# Patient Record
Sex: Male | Born: 1955 | Race: White | Hispanic: No | Marital: Married | Smoking: Former smoker
Health system: Southern US, Community
[De-identification: ages and names within clinical notes are randomized; demographics above are authoritative.]

## PROBLEM LIST (undated history)

## (undated) DIAGNOSIS — I10 Essential (primary) hypertension: Secondary | ICD-10-CM

## (undated) DIAGNOSIS — G4730 Sleep apnea, unspecified: Secondary | ICD-10-CM

## (undated) DIAGNOSIS — M1990 Unspecified osteoarthritis, unspecified site: Secondary | ICD-10-CM

## (undated) DIAGNOSIS — R112 Nausea with vomiting, unspecified: Secondary | ICD-10-CM

## (undated) DIAGNOSIS — Z9889 Other specified postprocedural states: Secondary | ICD-10-CM

## (undated) DIAGNOSIS — K219 Gastro-esophageal reflux disease without esophagitis: Secondary | ICD-10-CM

## (undated) DIAGNOSIS — R51 Headache: Secondary | ICD-10-CM

## (undated) HISTORY — PX: ROTATOR CUFF REPAIR: SHX139

## (undated) HISTORY — PX: KNEE ARTHROSCOPY: SUR90

## (undated) HISTORY — PX: APPENDECTOMY: SHX54

## (undated) HISTORY — PX: FOOT SURGERY: SHX648

## (undated) HISTORY — PX: TONSILLECTOMY: SUR1361

## (undated) HISTORY — PX: HIP SURGERY: SHX245

## (undated) HISTORY — PX: PENILE PROSTHESIS IMPLANT: SHX240

## (undated) HISTORY — PX: HERNIA REPAIR: SHX51

## (undated) HISTORY — PX: WRIST SURGERY: SHX841

## (undated) HISTORY — PX: COLON SURGERY: SHX602

---

## 2010-06-27 ENCOUNTER — Ambulatory Visit (HOSPITAL_COMMUNITY)
Admission: RE | Admit: 2010-06-27 | Discharge: 2010-06-27 | Disposition: A | Discharge status: Home / Self Care | Payer: Worker's Compensation | Source: Ambulatory Visit | Attending: Orthopedic Surgery | Admitting: Orthopedic Surgery

## 2010-06-27 ENCOUNTER — Other Ambulatory Visit (HOSPITAL_COMMUNITY): Payer: Worker's Compensation

## 2010-06-27 ENCOUNTER — Other Ambulatory Visit (HOSPITAL_COMMUNITY): Payer: Self-pay | Admitting: Orthopedic Surgery

## 2010-06-27 ENCOUNTER — Encounter (HOSPITAL_COMMUNITY)
Admission: RE | Admit: 2010-06-27 | Discharge: 2010-06-27 | Disposition: A | Discharge status: Home / Self Care | Payer: Worker's Compensation | Source: Ambulatory Visit | Attending: Orthopedic Surgery | Admitting: Orthopedic Surgery

## 2010-06-27 DIAGNOSIS — Z01811 Encounter for preprocedural respiratory examination: Secondary | ICD-10-CM

## 2010-06-27 DIAGNOSIS — Z01812 Encounter for preprocedural laboratory examination: Secondary | ICD-10-CM | POA: Insufficient documentation

## 2010-06-27 DIAGNOSIS — Z01810 Encounter for preprocedural cardiovascular examination: Secondary | ICD-10-CM | POA: Insufficient documentation

## 2010-06-27 DIAGNOSIS — Z01818 Encounter for other preprocedural examination: Secondary | ICD-10-CM | POA: Insufficient documentation

## 2010-06-27 LAB — URINALYSIS, ROUTINE W REFLEX MICROSCOPIC
Glucose, UA: 250 mg/dL — AB
Hgb urine dipstick: NEGATIVE
Ketones, ur: NEGATIVE mg/dL
Protein, ur: NEGATIVE mg/dL

## 2010-06-27 LAB — COMPREHENSIVE METABOLIC PANEL
ALT: 24 U/L (ref 0–53)
CO2: 30 mEq/L (ref 19–32)
Calcium: 9.2 mg/dL (ref 8.4–10.5)
GFR calc Af Amer: >60 mL/min (ref >60)
GFR calc non Af Amer: >60 mL/min (ref >60)
Glucose, Bld: 88 mg/dL (ref 70–99)
Sodium: 137 mEq/L (ref 135–145)
Total Bilirubin: 0.4 mg/dL (ref 0.3–1.2)

## 2010-06-27 LAB — CBC
HCT: 43.6 % (ref 39.0–52.0)
Hemoglobin: 15.6 g/dL (ref 13.0–17.0)
MCH: 31.4 pg (ref 26.0–34.0)
MCHC: 35.8 g/dL (ref 30.0–36.0)

## 2010-07-03 ENCOUNTER — Ambulatory Visit (HOSPITAL_COMMUNITY)
Admission: RE | Admit: 2010-07-03 | Discharge: 2010-07-04 | Disposition: A | Discharge status: Home / Self Care | Payer: Worker's Compensation | Source: Ambulatory Visit | Attending: Orthopedic Surgery | Admitting: Orthopedic Surgery

## 2010-07-03 DIAGNOSIS — G4733 Obstructive sleep apnea (adult) (pediatric): Secondary | ICD-10-CM | POA: Insufficient documentation

## 2010-07-03 DIAGNOSIS — M19019 Primary osteoarthritis, unspecified shoulder: Secondary | ICD-10-CM | POA: Insufficient documentation

## 2010-07-03 DIAGNOSIS — M25819 Other specified joint disorders, unspecified shoulder: Secondary | ICD-10-CM | POA: Insufficient documentation

## 2010-07-03 DIAGNOSIS — I10 Essential (primary) hypertension: Secondary | ICD-10-CM | POA: Insufficient documentation

## 2010-07-03 DIAGNOSIS — M67919 Unspecified disorder of synovium and tendon, unspecified shoulder: Secondary | ICD-10-CM | POA: Insufficient documentation

## 2010-07-03 DIAGNOSIS — M249 Joint derangement, unspecified: Secondary | ICD-10-CM | POA: Insufficient documentation

## 2010-07-03 DIAGNOSIS — M719 Bursopathy, unspecified: Secondary | ICD-10-CM | POA: Insufficient documentation

## 2010-07-03 LAB — PROTIME-INR: Prothrombin Time: 13.3 seconds (ref 11.6–15.2)

## 2010-07-12 NOTE — Op Note (Signed)
Gary [name], Gary NO.:  [id number]  MEDICAL RECORD NO.:  [id number]  LOCATION:  5014                         FACILITY:  [facility]  PHYSICIAN:  Vania Rea. Tailer Volkert, M.D.  DATE OF BIRTH:  1955-06-23  DATE OF PROCEDURE: DATE OF DISCHARGE:                              OPERATIVE REPORT   PREOPERATIVE DIAGNOSES: 1. Chronic left shoulder impingement syndrome. 2. Left shoulder acromioclavicular joint arthropathy.  POSTOPERATIVE DIAGNOSES: 1. Chronic left shoulder impingement syndrome. 2. Left shoulder acromioclavicular joint arthropathy. 3. Partial articular rotator cuff tear. 4. Extensive glenoid labral tear.  PROCEDURES: 1. Left shoulder diagnostic arthroscopy. 2. Debridement of complex and extensive labral tear. 3. Debridement of partial articular rotator cuff tear. 4. Arthroscopic subacromial decompression and bursectomy. 5. Arthroscopic distal clavicle resection.  SURGEON:  Vania Rea. Madalyn Legner, MD  ASSISTANT:  Lucita Lora. Shuford, PAC  ANESTHESIA:  General endotracheal as well as an interscalene block.  ESTIMATED BLOOD LOSS:  Minimal.  DRAINS:  None.  HISTORY:  Gary [name] is a 55 year old gentleman who has had chronic and progressively increasing left shoulder pain with impingement syndrome symptoms that have been refractory to prolonged attempts at conservative management.  Due to his ongoing pain and functional limitations, he was brought to the operating at this time for planned left shoulder arthroscopy as described below.  We preoperatively counseled Gary [name] on treatment options as well as risks versus benefits thereof.  Possible surgical complications were reviewed including a potential for bleeding, infection, neurovascular injury, persistent pain, loss of motion, anesthetic complication, and possible need for additional surgery.  He understands and accepts and agrees with our planned procedure.  PROCEDURE IN DETAIL:  After undergoing  routine preop evaluation, the patient received prophylactic antibiotics and an interscalene block was established in the holding area by the Anesthesia Department.  Placed supine on operating table, underwent smooth induction of general endotracheal anesthesia.  Turned to the right lateral decubitus position on beanbag and appropriately padded and protected.  Left shoulder was then suspended at 70 degrees of abduction with 10 pounds of traction. Left shoulder region was sterilely prepped and draped in a standard fashion.  Time-out was called.  Posterior portal was established at the glenohumeral joint and anterior portal was established under direct visualization.  Overall, the glenohumeral articular surfaces were in good condition.  There was extensive degenerative tearing of the labrum anteriorly, superiorly, and posteriorly, and all these areas were freed with the shaver to a stable margin.  Biceps anchor was stable.  Biceps tendon was normal in caliber.  There was articular-sided tear near the distal supraspinatus which we debrided with a shaver.  I would estimate representing less than 5-10% of the thickness of tendon.  No instability patterns were noted.  At this point, final inspection and irrigation was then completed.  Fluid and instruments were removed.  Arm was dropped down to 30 degrees of abduction with arthroscope introduced into the subacromial space and posterior portal and then direct lateral portal established in the subacromial space.  Abundant dense inflammatory bursal tissue was encountered.  This was divided and excised with combination of shaver and Stryker wand.  The wand was then used to remove the periosteum from the undersurface  of the anterior half of the acromion and then a subacromial depression was performed with a burr creating a type 1 morphology.  Portals were then established directly anterior to the distal clavicle and distal clavicle resection  was performed with a burr.  Care was taken to make sure the entire circumference of the distal clavicle could be visualized to ensure adequate removal of the bone.  We then completed subacromial/subdeltoid bursectomy.  Bursal surface of rotator that was found to be intact. Final hemostasis was obtained.  Fluid and instruments were removed.  The portals were closed with Monocryl and Steri-Strips.  The bulky dry dressing was then taped to the left shoulder.  Left arm was put in sling immobilizer.  The patient was then awakened, extubated, and taken to the recovery room in stable condition.     Vania Rea. Gaige Sebo, M.D.     KMS/MEDQ  D:  07/03/2010  T:  07/04/2010  Job:  [id number]  Electronically Signed by Francena Hanly M.D. on 07/12/2010 10:33:51 AM

## 2011-08-20 ENCOUNTER — Encounter (HOSPITAL_COMMUNITY): Payer: Self-pay | Admitting: Pharmacy Technician

## 2011-08-24 NOTE — Consult Note (Signed)
Gary [name] 08/24/2011 9:00 AM Location: [facility] Patient #: [id number] WC DOB: 12/21/1955 Married / Language: English / Race: White Male   History of Present Illness(Gary [name] Gary Highman, PA-C; 08/24/2011 11:30 AM) The patient is a 56 year old male who comes in today for a preoperative History and Physical. The patient is scheduled for a ACDF C3-4 for cervical facet arthrosis (for neck pain and now significant HA) to be performed by Dr. [name], MD at [facility] on Thursday, September 03, 2011 at 0730 .    Allergies(Lori W Lamb; 08/24/2011 11:21 AM) CONTRAST DYES. 04/30/2010   Family History(Lori W Gary [name]; 08/24/2011 11:21 AM) Cancer. grandfather mothers side Cerebrovascular Accident. father Congestive Heart Failure. father Diabetes Mellitus. mother mother and brother Heart Disease. father and grandfather fathers side Hypertension. father Rheumatoid Arthritis. grandmother fathers side   Social History(Lori Zipporah Plants; 08/24/2011 11:21 AM) Alcohol use. current drinker; drinks beer; only occasionally per week Children. 0 Current work status. working full time Drug/Alcohol Rehab (Currently). no Drug/Alcohol Rehab (Previously). no Exercise. Exercises daily; does running / walking Exercises rarely; does running / walking Illicit drug use. no Living situation. live with spouse Marital status. married Number of flights of stairs before winded. greater than 5 4-5 Pain Contract. no Tobacco / smoke exposure. no Tobacco use. Former smoker. former smoker; smoke(d) less than 1/2 pack(s) per day   Medication History(Lori W Lamb; 08/24/2011 11:22 AM) Nuvigil (250MG  Tablet, 1 Oral prn) Active. PriLOSEC (40MG  Capsule DR, 1 Oral prn) Active.   Past Surgical History(Lori W Gary [name]; 08/24/2011 11:21 AM) Appendectomy Arthroscopy of Knee. right Foot Surgery. bilateral Rotator Cuff Repair. left bilateral Tonsillectomy   Other  Problems(Lori W Lamb; 08/24/2011 11:21 AM) Gastroesophageal Reflux Disease Migraine Headache Sleep Apnea  Note: ivp dye and shell fish   Review of Systems(Lori W Lamb; 08/24/2011 11:21 AM) General:Present- Weight Gain. Not Present- Chills, Fever, Night Sweats, Appetite Loss, Fatigue, Feeling sick and Weight Loss. Skin:Not Present- Itching, Rash, Skin Color Changes, Ulcer, Psoriasis and Change in Hair or Nails. HEENT:Not Present- Sensitivity to light, Hearing problems, Nose Bleed and Ringing in the Ears. Neck:Not Present- Swollen Glands and Neck Mass. Respiratory:Not Present- Snoring, Chronic Cough, Bloody sputum and Dyspnea. Cardiovascular:Not Present- Shortness of Breath, Chest Pain, Swelling of Extremities, Leg Cramps and Palpitations. Gastrointestinal:Not Present- Bloody Stool, Heartburn, Abdominal Pain, Vomiting, Nausea and Incontinence of Stool. Male Genitourinary:Not Present- Blood in Urine, Frequency, Incontinence and Nocturia. Musculoskeletal:Not Present- Muscle Weakness, Muscle Pain, Joint Stiffness, Joint Swelling, Joint Pain and Back Pain. Neurological:Present- Headaches and Dizziness. Not Present- Tingling, Numbness, Burning and Tremor. Psychiatric:Present- Memory Loss. Not Present- Anxiety and Depression. Endocrine:Not Present- Cold Intolerance, Heat Intolerance, Excessive hunger and Excessive Thirst. Hematology:Not Present- Abnormal Bleeding, Anemia, Blood Clots and Easy Bruising.   Vitals(Lori W Lamb; 08/24/2011 11:24 AM) 08/24/2011 11:22 AM Weight: 197 lb Height: 67.5 in Body Surface Area: 2.06 m Body Mass Index: 30.4 kg/m Pulse: 75 (Regular) BP: 128/86 (Sitting, Left Arm, Standard)    Physical Exam(Xavyer Steenson J Larence Thone, PA-C; 08/24/2011 11:55 AM) The physical exam findings are as follows:   General General Appearance- pleasant. Not in acute distress. Orientation- Oriented X3. Build & Nutrition- Well nourished and Well developed.  Posture- Normal posture. Gait- Normal. Mental Status- Alert.   Integumentary General Characteristics:Surgical Scars- no surgical scar evidence of previous cervical surgery. Cervical Spine- Skin examination of the cervical spine is without deformity, skin lesions, lacerations or abrasions.   Head and Neck Neck Global Assessment- supple. no lymphadenopathy and no nucchal rigidty.  Eye Pupil- Bilateral- Normal, Direct reaction to light normal, Equal and Regular. Motion- Bilateral- EOMI.   Chest and Lung Exam Auscultation: Breath sounds:- Clear.   Cardiovascular Auscultation:Rhythm- Regular rate and rhythm. Heart Sounds- Normal heart sounds.   Abdomen Palpation/Percussion:Palpation and Percussion of the abdomen reveal - Non Tender, No Rebound tenderness and Soft.   Peripheral Vascular Upper Extremity: Palpation:Radial pulse- Bilateral- 2+.   Neurologic Sensation:Upper Extremity- Bilateral- sensation is intact in the upper extremity. Reflexes:Biceps Reflex- Bilateral- 1+. Brachioradialis Reflex- Bilateral- 1+. Triceps Reflex- Bilateral- 1+. Babinski- Bilateral- Babinski not present. Clonus- Bilateral- clonus not present. Hoffman's Sign- Bilateral- Hoffman's sign not present. Testing:   Musculoskeletal Spine/Ribs/Pelvis Cervical Spine : Inspection and Palpation:Tenderness- no soft tissue tenderness to palpation and no bony tenderness to palpation. bony/soft tissue palpation of the cervical spine and shoulders does not recreate their typical pain. Strength and Tone: Strength:Deltoid- Bilateral- 5/5. Biceps- Bilateral- 5/5. Triceps- Bilateral- 5/5. Wrist Extension- Bilateral- 5/5. Hand Grip- Bilateral- 5/5. Heel walk- Bilateral- able to heel walk without difficulty. Toe Walk- Bilateral- able to walk on toes without difficulty. Heel-Toe Walk- Bilateral- able to heel-toe walk without difficulty. ROM- Flexion-  Mildly decreased and painful. Extension- Mildly decreased and painful. Pain:- neither flexion or extension is more painful than the other. Special Testing- axial compression test negative and cross chest impingement test negative. Non-Anatomic Signs- No non-anatomic signs present. Upper Extremity Range of Motion:- No truesholder pain with IR/ER of the shoulders.   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 Risks/benefits/alternatives to the procedure/expectations following the procedure have been reviewed with the patient by Dr. Shon Baton. He understands that the goal of surgery is to reduce not eliminate his pain. He understands the risks and the goals.  MRI of the cervical spine dated 04/13/11 demonstrates severe left facet arthritis C3-4 with subchondral cyst and edema in the bones and adjacent soft tissues. Inflammatory changes in the left neural foramen at C3-4.  He is scheduled to complete his pre-op hospital requirements 08/27/11. He has been medically cleared for the procedure by Dr. Leonette Most his PCP. Please see the scanned clearance form in the patients office chart for the specifics. He has been fitted for a cervical collar. He knows to bring this with him the morning of surgery.   All of his questions have been encouraged, addressed and answered. Plan, at this time, is to proceed with surgery as scheduled.   Signed electronically by Gwinda Maine, PA-C (08/24/2011 11:55 AM)   Gary [name] 07/27/2011 9:20 AM Location: [place] LOCATION Patient #: [id number] WC DOB: June 24, 1955 Married /  Language: [place] / Race: White Male   History of Present Illness(Sharon Gillian Shields; 07/27/2011 11:25 AM) The patient is a 56 year old male who presents today for follow up of their neck. The patient is being followed for their neck pain. They are 1 year(s) out from injury. The following medication has been used for pain control: Hydrocodone. The patient presents today following ESI (Bilateral C3-4 facet injections 07/22/11).    Subjective Transcription(DAHARI Sheela Stack, MD; 07/31/2011 1:29 PM)  He returns today for follow up with his wife. We have gone over the surgical procedure which is an ACDF at C3-4. I reviewed the  risks and benefits of the procedure which include infection, bleeding, nerve damage, death, stroke, paralysis, failure to heal, need for further surgery, ongoing or worse pain, throat pain, swallowing difficulties, hoarseness in the voice, loss of bowel and bladder control. All of his and his wife's questions were addressed.    Allergies(Sharon Gillian Shields; 07/27/2011 11:25 AM) CONTRAST DYES. 04/30/2010   Social History(Sharon J Roxan Hockey; 07/27/2011 11:26 AM) Tobacco use. Former smoker.   Medication History(Sharon Gillian Shields; 07/27/2011 11:26 AM) Norco (5-325MG  Tablet, Oral) Active. (prn) Valsartan (40MG  Tablet, Oral) Active. Nuvigil (250MG  Tablet, Oral) Active.   Plans Transcription(DAHARI Sheela Stack, MD; 07/31/2011 1:29 PM)  He would like to proceed with the surgery as soon as possible. As soon as we have approval from [employer], we can go ahead and set up a date for the surgery.    Miscellaneous Transcription(DAHARI Sheela Stack, MD; 07/31/2011 1:29 PM)  Venita Lick, M. D./slk    T: 07-30-11  D: 07-28-11      Signed electronically by Alvy Beal, MD (07/28/2011 12:54 PM)

## 2011-08-26 NOTE — Pre-Procedure Instructions (Signed)
20 Gary [name]  08/26/2011   Your procedure is scheduled on:  Thursday, August 29th  Report to [facility] at 0530 AM.  Call this number if you have problems the morning of surgery: [phone]   Remember:   Do not eat food or drink:After Midnight.  Take these medicines the morning of surgery with A SIP OF WATER: vicodin if needed   Do not wear jewelry.  Do not wear lotions, powders, or perfumes.   Do not shave 48 hours prior to surgery. Men may shave face and neck.  Do not bring valuables to the hospital.  Contacts, dentures or bridgework may not be worn into surgery.  Leave suitcase in the car. After surgery it may be brought to your room.  For patients admitted to the hospital, checkout time is 11:00 AM the day of discharge.   Special Instructions: CHG Shower Use Special Wash: 1/2 bottle night before surgery and 1/2 bottle morning of surgery.   Please read over the following fact sheets that you were given: Pain Booklet, Coughing and Deep Breathing, MRSA Information and Surgical Site Infection Prevention

## 2011-08-27 ENCOUNTER — Encounter (HOSPITAL_COMMUNITY): Payer: Self-pay

## 2011-08-27 ENCOUNTER — Encounter (HOSPITAL_COMMUNITY)
Admission: RE | Admit: 2011-08-27 | Discharge: 2011-08-27 | Disposition: A | Discharge status: Home / Self Care | Payer: Worker's Compensation | Source: Ambulatory Visit | Attending: Orthopedic Surgery | Admitting: Orthopedic Surgery

## 2011-08-27 ENCOUNTER — Encounter (HOSPITAL_COMMUNITY)
Admission: RE | Admit: 2011-08-27 | Discharge: 2011-08-27 | Disposition: A | Discharge status: Home / Self Care | Payer: Worker's Compensation | Source: Ambulatory Visit | Attending: Physician Assistant | Admitting: Physician Assistant

## 2011-08-27 HISTORY — DX: Gastro-esophageal reflux disease without esophagitis: K21.9

## 2011-08-27 HISTORY — DX: Headache: R51

## 2011-08-27 HISTORY — DX: Sleep apnea, unspecified: G47.30

## 2011-08-27 HISTORY — DX: Unspecified osteoarthritis, unspecified site: M19.90

## 2011-08-27 HISTORY — DX: Nausea with vomiting, unspecified: R11.2

## 2011-08-27 HISTORY — DX: Other specified postprocedural states: Z98.890

## 2011-08-27 LAB — SURGICAL PCR SCREEN: MRSA, PCR: NEGATIVE

## 2011-08-27 LAB — BASIC METABOLIC PANEL
Calcium: 10.3 mg/dL (ref 8.4–10.5)
GFR calc non Af Amer: 77 mL/min — ABNORMAL LOW (ref >90)
Glucose, Bld: 116 mg/dL — ABNORMAL HIGH (ref 70–99)
Sodium: 140 mEq/L (ref 135–145)

## 2011-08-27 LAB — CBC
HCT: 45.1 % (ref 39.0–52.0)
Hemoglobin: 15.4 g/dL (ref 13.0–17.0)
MCH: 30.6 pg (ref 26.0–34.0)
MCV: 89.5 fL (ref 78.0–100.0)
Platelets: 204 10*3/uL (ref 150–400)
RBC: 5.04 MIL/uL (ref 4.22–5.81)

## 2011-08-27 NOTE — Progress Notes (Addendum)
REQUESTED SLEEP STUDY FROM PATIENT'S MEDICAL MD DR. KALISH IN [place]. ([facility])

## 2011-09-02 MED ORDER — CEFAZOLIN SODIUM-DEXTROSE 2-3 GM-% IV SOLR
2.0000 g | INTRAVENOUS | Status: AC
  Administered 2011-09-03: 2 g via INTRAVENOUS
  Filled 2011-09-02: qty 50

## 2011-09-03 ENCOUNTER — Ambulatory Visit (HOSPITAL_COMMUNITY): Payer: Worker's Compensation | Admitting: Anesthesiology

## 2011-09-03 ENCOUNTER — Encounter (HOSPITAL_COMMUNITY): Payer: Self-pay | Admitting: Anesthesiology

## 2011-09-03 ENCOUNTER — Encounter (HOSPITAL_COMMUNITY)
Admission: RE | Disposition: A | Discharge status: Home / Self Care | Payer: Self-pay | Source: Ambulatory Visit | Attending: Orthopedic Surgery

## 2011-09-03 ENCOUNTER — Ambulatory Visit (HOSPITAL_COMMUNITY): Payer: Worker's Compensation

## 2011-09-03 ENCOUNTER — Ambulatory Visit (HOSPITAL_COMMUNITY)
Admission: RE | Admit: 2011-09-03 | Discharge: 2011-09-04 | Disposition: A | Discharge status: Home / Self Care | Payer: Worker's Compensation | Source: Ambulatory Visit | Attending: Orthopedic Surgery | Admitting: Orthopedic Surgery

## 2011-09-03 ENCOUNTER — Encounter (HOSPITAL_COMMUNITY): Payer: Self-pay | Admitting: *Deleted

## 2011-09-03 DIAGNOSIS — M47812 Spondylosis without myelopathy or radiculopathy, cervical region: Secondary | ICD-10-CM | POA: Insufficient documentation

## 2011-09-03 DIAGNOSIS — Z01812 Encounter for preprocedural laboratory examination: Secondary | ICD-10-CM | POA: Insufficient documentation

## 2011-09-03 DIAGNOSIS — G4730 Sleep apnea, unspecified: Secondary | ICD-10-CM | POA: Insufficient documentation

## 2011-09-03 DIAGNOSIS — Z01818 Encounter for other preprocedural examination: Secondary | ICD-10-CM | POA: Insufficient documentation

## 2011-09-03 DIAGNOSIS — K219 Gastro-esophageal reflux disease without esophagitis: Secondary | ICD-10-CM | POA: Insufficient documentation

## 2011-09-03 DIAGNOSIS — M542 Cervicalgia: Secondary | ICD-10-CM

## 2011-09-03 HISTORY — PX: ANTERIOR CERVICAL DECOMP/DISCECTOMY FUSION: SHX1161

## 2011-09-03 HISTORY — DX: Essential (primary) hypertension: I10

## 2011-09-03 HISTORY — PX: CERVICAL FUSION: SHX112

## 2011-09-03 SURGERY — ANTERIOR CERVICAL DECOMPRESSION/DISCECTOMY FUSION 1 LEVEL
Anesthesia: General | Site: Back | Wound class: Clean

## 2011-09-03 MED ORDER — SODIUM CHLORIDE 0.9 % IV SOLN: 250.0000 mL | INTRAVENOUS | Status: DC

## 2011-09-03 MED ORDER — DEXAMETHASONE SODIUM PHOSPHATE 4 MG/ML IJ SOLN
4.0000 mg | Freq: Four times a day (QID) | INTRAMUSCULAR | Status: DC
  Administered 2011-09-03 – 2011-09-04 (×3): 4 mg via INTRAVENOUS
  Filled 2011-09-03 (×10): qty 1

## 2011-09-03 MED ORDER — MENTHOL 3 MG MT LOZG
1.0000 | LOZENGE | OROMUCOSAL | Status: DC | PRN
  Filled 2011-09-03: qty 9

## 2011-09-03 MED ORDER — THROMBIN 20000 UNITS EX SOLR
CUTANEOUS | Status: DC | PRN
  Administered 2011-09-03: 08:00:00 via TOPICAL

## 2011-09-03 MED ORDER — NEOSTIGMINE METHYLSULFATE 1 MG/ML IJ SOLN
INTRAMUSCULAR | Status: DC | PRN
  Administered 2011-09-03: 4 mg via INTRAVENOUS

## 2011-09-03 MED ORDER — ZOLPIDEM TARTRATE 5 MG PO TABS: 5.0000 mg | ORAL_TABLET | Freq: Every evening | ORAL | Status: DC | PRN

## 2011-09-03 MED ORDER — BUPIVACAINE-EPINEPHRINE 0.25% -1:200000 IJ SOLN
INTRAMUSCULAR | Status: DC | PRN
  Administered 2011-09-03: 2 mL

## 2011-09-03 MED ORDER — METHOCARBAMOL 500 MG PO TABS
500.0000 mg | ORAL_TABLET | Freq: Four times a day (QID) | ORAL | Status: DC | PRN
  Administered 2011-09-03: 500 mg via ORAL
  Filled 2011-09-03: qty 1

## 2011-09-03 MED ORDER — GLYCOPYRROLATE 0.2 MG/ML IJ SOLN
INTRAMUSCULAR | Status: DC | PRN
  Administered 2011-09-03: .6 mg via INTRAVENOUS

## 2011-09-03 MED ORDER — METHOCARBAMOL 100 MG/ML IJ SOLN
500.0000 mg | Freq: Four times a day (QID) | INTRAMUSCULAR | Status: DC | PRN
  Administered 2011-09-03: 500 mg via INTRAVENOUS
  Filled 2011-09-03: qty 5

## 2011-09-03 MED ORDER — ONDANSETRON HCL 4 MG/2ML IJ SOLN
INTRAMUSCULAR | Status: DC | PRN
  Administered 2011-09-03: 4 mg via INTRAVENOUS

## 2011-09-03 MED ORDER — LACTATED RINGERS IV SOLN
INTRAVENOUS | Status: DC
  Administered 2011-09-03 – 2011-09-04 (×2): via INTRAVENOUS

## 2011-09-03 MED ORDER — THROMBIN 20000 UNITS EX SOLR: CUTANEOUS | Status: DC | PRN

## 2011-09-03 MED ORDER — SODIUM CHLORIDE 0.9 % IJ SOLN: 3.0000 mL | Freq: Two times a day (BID) | INTRAMUSCULAR | Status: DC

## 2011-09-03 MED ORDER — ONDANSETRON HCL 4 MG/2ML IJ SOLN: 4.0000 mg | Freq: Once | INTRAMUSCULAR | Status: DC | PRN

## 2011-09-03 MED ORDER — FENTANYL CITRATE 0.05 MG/ML IJ SOLN
INTRAMUSCULAR | Status: DC | PRN
  Administered 2011-09-03: 50 ug via INTRAVENOUS
  Administered 2011-09-03: 150 ug via INTRAVENOUS
  Administered 2011-09-03 (×2): 50 ug via INTRAVENOUS
  Administered 2011-09-03: 25 ug via INTRAVENOUS

## 2011-09-03 MED ORDER — HYDROMORPHONE HCL PF 1 MG/ML IJ SOLN
INTRAMUSCULAR | Status: AC
  Filled 2011-09-03: qty 1

## 2011-09-03 MED ORDER — CEFAZOLIN SODIUM 1-5 GM-% IV SOLN
1.0000 g | Freq: Three times a day (TID) | INTRAVENOUS | Status: AC
  Administered 2011-09-03 (×2): 1 g via INTRAVENOUS
  Filled 2011-09-03 (×2): qty 50

## 2011-09-03 MED ORDER — PHENOL 1.4 % MT LIQD
1.0000 | OROMUCOSAL | Status: DC | PRN
  Administered 2011-09-03: 1 via OROMUCOSAL
  Filled 2011-09-03: qty 177

## 2011-09-03 MED ORDER — ACETAMINOPHEN 10 MG/ML IV SOLN
1000.0000 mg | Freq: Four times a day (QID) | INTRAVENOUS | Status: AC
  Administered 2011-09-03 – 2011-09-04 (×4): 1000 mg via INTRAVENOUS
  Filled 2011-09-03 (×4): qty 100

## 2011-09-03 MED ORDER — LACTATED RINGERS IV SOLN
INTRAVENOUS | Status: DC | PRN
  Administered 2011-09-03 (×2): via INTRAVENOUS

## 2011-09-03 MED ORDER — PROPOFOL 10 MG/ML IV EMUL
INTRAVENOUS | Status: DC | PRN
  Administered 2011-09-03: 130 mg via INTRAVENOUS

## 2011-09-03 MED ORDER — ACETAMINOPHEN 10 MG/ML IV SOLN
INTRAVENOUS | Status: AC
  Filled 2011-09-03: qty 100

## 2011-09-03 MED ORDER — LACTATED RINGERS IV SOLN: INTRAVENOUS | Status: DC

## 2011-09-03 MED ORDER — MEPERIDINE HCL 25 MG/ML IJ SOLN: 6.2500 mg | INTRAMUSCULAR | Status: DC | PRN

## 2011-09-03 MED ORDER — DEXAMETHASONE SODIUM PHOSPHATE 10 MG/ML IJ SOLN
INTRAMUSCULAR | Status: DC | PRN
  Administered 2011-09-03: 10 mg via INTRAVENOUS

## 2011-09-03 MED ORDER — OXYCODONE HCL 5 MG PO TABS: 10.0000 mg | ORAL_TABLET | ORAL | Status: DC | PRN

## 2011-09-03 MED ORDER — SODIUM CHLORIDE 0.9 % IJ SOLN: 3.0000 mL | INTRAMUSCULAR | Status: DC | PRN

## 2011-09-03 MED ORDER — ROCURONIUM BROMIDE 100 MG/10ML IV SOLN
INTRAVENOUS | Status: DC | PRN
  Administered 2011-09-03: 10 mg via INTRAVENOUS
  Administered 2011-09-03: 50 mg via INTRAVENOUS

## 2011-09-03 MED ORDER — HYDROMORPHONE HCL PF 1 MG/ML IJ SOLN
0.2500 mg | INTRAMUSCULAR | Status: DC | PRN
  Administered 2011-09-03 (×4): 0.5 mg via INTRAVENOUS

## 2011-09-03 MED ORDER — MIDAZOLAM HCL 5 MG/5ML IJ SOLN
INTRAMUSCULAR | Status: DC | PRN
  Administered 2011-09-03: 2 mg via INTRAVENOUS

## 2011-09-03 MED ORDER — ACETAMINOPHEN 10 MG/ML IV SOLN
INTRAVENOUS | Status: DC | PRN
  Administered 2011-09-03: 1000 mg via INTRAVENOUS

## 2011-09-03 MED ORDER — DOCUSATE SODIUM 100 MG PO CAPS
100.0000 mg | ORAL_CAPSULE | Freq: Two times a day (BID) | ORAL | Status: DC
  Administered 2011-09-03 (×2): 100 mg via ORAL
  Filled 2011-09-03 (×3): qty 1

## 2011-09-03 MED ORDER — THROMBIN 20000 UNITS EX SOLR
CUTANEOUS | Status: AC
  Filled 2011-09-03: qty 20000

## 2011-09-03 MED ORDER — MORPHINE SULFATE 2 MG/ML IJ SOLN
1.0000 mg | INTRAMUSCULAR | Status: DC | PRN
  Administered 2011-09-03 (×3): 2 mg via INTRAVENOUS
  Administered 2011-09-04: 4 mg via INTRAVENOUS
  Filled 2011-09-03 (×3): qty 1
  Filled 2011-09-03: qty 2

## 2011-09-03 MED ORDER — LIDOCAINE HCL (CARDIAC) 20 MG/ML IV SOLN
INTRAVENOUS | Status: DC | PRN
  Administered 2011-09-03: 100 mg via INTRAVENOUS

## 2011-09-03 MED ORDER — PROPOFOL 10 MG/ML IV BOLUS
INTRAVENOUS | Status: DC | PRN
  Administered 2011-09-03: 130 mg via INTRAVENOUS

## 2011-09-03 MED ORDER — ONDANSETRON HCL 4 MG/2ML IJ SOLN
4.0000 mg | INTRAMUSCULAR | Status: DC | PRN
  Administered 2011-09-03: 4 mg via INTRAVENOUS
  Filled 2011-09-03: qty 2

## 2011-09-03 MED ORDER — DEXAMETHASONE 4 MG PO TABS
4.0000 mg | ORAL_TABLET | Freq: Four times a day (QID) | ORAL | Status: DC
  Administered 2011-09-03: 4 mg via ORAL
  Filled 2011-09-03 (×8): qty 1

## 2011-09-03 MED ORDER — BUPIVACAINE-EPINEPHRINE PF 0.25-1:200000 % IJ SOLN
INTRAMUSCULAR | Status: AC
  Filled 2011-09-03: qty 30

## 2011-09-03 SURGICAL SUPPLY — 62 items
BLADE SURG ROTATE 9660 (MISCELLANEOUS) IMPLANT
BUR EGG ELITE 4.0 (BURR) IMPLANT
BUR MATCHSTICK NEURO 3.0 LAGG (BURR) IMPLANT
CANISTER SUCTION 2500CC (MISCELLANEOUS) ×2 IMPLANT
CLOTH BEACON ORANGE TIMEOUT ST (SAFETY) ×2 IMPLANT
CLSR STERI-STRIP ANTIMIC 1/2X4 (GAUZE/BANDAGES/DRESSINGS) ×2 IMPLANT
CORDS BIPOLAR (ELECTRODE) ×2 IMPLANT
COVER SURGICAL LIGHT HANDLE (MISCELLANEOUS) ×2 IMPLANT
CRADLE DONUT ADULT HEAD (MISCELLANEOUS) ×2 IMPLANT
DERMABOND ADVANCED (GAUZE/BANDAGES/DRESSINGS)
DERMABOND ADVANCED .7 DNX12 (GAUZE/BANDAGES/DRESSINGS) IMPLANT
DRAPE C-ARM 42X72 X-RAY (DRAPES) ×2 IMPLANT
DRAPE POUCH INSTRU U-SHP 10X18 (DRAPES) ×2 IMPLANT
DRAPE SURG 17X23 STRL (DRAPES) IMPLANT
DRAPE U-SHAPE 47X51 STRL (DRAPES) ×2 IMPLANT
DRSG MEPILEX BORDER 4X4 (GAUZE/BANDAGES/DRESSINGS) IMPLANT
DURAPREP 26ML APPLICATOR (WOUND CARE) ×2 IMPLANT
ELECT COATED BLADE 2.86 ST (ELECTRODE) ×2 IMPLANT
ELECT REM PT RETURN 9FT ADLT (ELECTROSURGICAL) ×2
ELECTRODE REM PT RTRN 9FT ADLT (ELECTROSURGICAL) ×1 IMPLANT
GLOVE BIOGEL PI IND STRL 6.5 (GLOVE) ×2 IMPLANT
GLOVE BIOGEL PI IND STRL 8.5 (GLOVE) ×1 IMPLANT
GLOVE BIOGEL PI INDICATOR 6.5 (GLOVE) ×2
GLOVE BIOGEL PI INDICATOR 8.5 (GLOVE) ×1
GLOVE ECLIPSE 6.0 STRL STRAW (GLOVE) ×2 IMPLANT
GLOVE ECLIPSE 8.5 STRL (GLOVE) ×2 IMPLANT
GLOVE SURG SS PI 6.5 STRL IVOR (GLOVE) ×4 IMPLANT
GOWN PREVENTION PLUS XLARGE (GOWN DISPOSABLE) IMPLANT
GOWN PREVENTION PLUS XXLARGE (GOWN DISPOSABLE) ×2 IMPLANT
GOWN STRL NON-REIN LRG LVL3 (GOWN DISPOSABLE) ×2 IMPLANT
GOWN STRL REIN XL XLG (GOWN DISPOSABLE) ×4 IMPLANT
INTERLOCK LRDTC CRVCL VBR 7MM (Bone Implant) ×1 IMPLANT
KIT BASIN OR (CUSTOM PROCEDURE TRAY) ×2 IMPLANT
KIT ROOM TURNOVER OR (KITS) ×2 IMPLANT
LORDOTIC CERVICAL VBR 7MM SM (Bone Implant) ×2 IMPLANT
NEEDLE SPNL 18GX3.5 QUINCKE PK (NEEDLE) ×2 IMPLANT
NS IRRIG 1000ML POUR BTL (IV SOLUTION) ×2 IMPLANT
PACK ORTHO CERVICAL (CUSTOM PROCEDURE TRAY) ×2 IMPLANT
PACK UNIVERSAL I (CUSTOM PROCEDURE TRAY) ×2 IMPLANT
PAD ARMBOARD 7.5X6 YLW CONV (MISCELLANEOUS) ×4 IMPLANT
PIN DISTRACTION 12MM (PIN) ×2
PIN DSTRCT 12XNS SS ACIS (PIN) ×2 IMPLANT
PLATE LV 1 12MM (Plate) ×2 IMPLANT
PUTTY BONE DBX 2.5 MIS (Bone Implant) ×2 IMPLANT
SCREW 4.0X14MM (Screw) ×2 IMPLANT
SCREW 4.0X16MM (Screw) ×4 IMPLANT
SCREW BN 14X4XSLF DRL VA SLF (Screw) ×2 IMPLANT
SPONGE INTESTINAL PEANUT (DISPOSABLE) ×2 IMPLANT
SPONGE SURGIFOAM ABS GEL 100 (HEMOSTASIS) ×2 IMPLANT
STRIP CLOSURE SKIN 1/2X4 (GAUZE/BANDAGES/DRESSINGS) IMPLANT
SURGIFLO TRUKIT (HEMOSTASIS) IMPLANT
SUT MNCRL AB 3-0 PS2 18 (SUTURE) ×2 IMPLANT
SUT SILK 2 0 (SUTURE)
SUT SILK 2-0 18XBRD TIE 12 (SUTURE) IMPLANT
SUT VIC AB 2-0 CT1 18 (SUTURE) ×2 IMPLANT
SYR BULB IRRIGATION 50ML (SYRINGE) ×2 IMPLANT
SYR CONTROL 10ML LL (SYRINGE) ×2 IMPLANT
TAPE CLOTH 4X10 WHT NS (GAUZE/BANDAGES/DRESSINGS) ×4 IMPLANT
TAPE UMBILICAL COTTON 1/8X30 (MISCELLANEOUS) ×2 IMPLANT
TOWEL OR 17X24 6PK STRL BLUE (TOWEL DISPOSABLE) ×2 IMPLANT
TOWEL OR 17X26 10 PK STRL BLUE (TOWEL DISPOSABLE) ×2 IMPLANT
WATER STERILE IRR 1000ML POUR (IV SOLUTION) IMPLANT

## 2011-09-03 NOTE — Transfer of Care (Signed)
Immediate Anesthesia Transfer of Care Note  Patient: Gary [name]  Procedure(s) Performed: Procedure(s) (LRB): ANTERIOR CERVICAL DECOMPRESSION/DISCECTOMY FUSION 1 LEVEL (N/A)  Patient Location: PACU  Anesthesia Type: General  Level of Consciousness: awake, alert  and oriented  Airway & Oxygen Therapy: Patient Spontanous Breathing and Patient connected to face mask oxygen  Post-op Assessment: Report given to PACU RN, Post -op Vital signs reviewed and stable and Patient moving all extremities  Post vital signs: Reviewed and stable  Complications: No apparent anesthesia complications

## 2011-09-03 NOTE — H&P (Signed)
H+P reviewed (written by my PA Norval Gable - listed as a consult note) No change to clinical exam

## 2011-09-03 NOTE — Brief Op Note (Signed)
09/03/2011  9:55 AM  PATIENT:  Gary [name]  56 y.o. male  PRE-OPERATIVE DIAGNOSIS:  cervical facet arthrosis   POST-OPERATIVE DIAGNOSIS:  cervical facet arthrosis   PROCEDURE:  Procedure(s) (LRB): ANTERIOR CERVICAL DECOMPRESSION/DISCECTOMY FUSION 1 LEVEL (N/A)  SURGEON:  Surgeon(s) and Role:    * Venita Lick, MD - Primary  PHYSICIAN ASSISTANT:   ASSISTANTS: DIANA KOVACH   ANESTHESIA:   general  EBL:  Total I/O In: 1000 [I.V.:1000] Out: 50 [Blood:50]  BLOOD ADMINISTERED:none  DRAINS: none   LOCAL MEDICATIONS USED:  MARCAINE     SPECIMEN:  No Specimen  DISPOSITION OF SPECIMEN:  N/A  COUNTS:  YES  TOURNIQUET:  * No tourniquets in log *  DICTATION: .Other Dictation: Dictation Number [id number]  PLAN OF CARE: Admit for overnight observation  PATIENT DISPOSITION:  PACU - hemodynamically stable.

## 2011-09-03 NOTE — Anesthesia Postprocedure Evaluation (Signed)
Anesthesia Post Note  Patient: Gary [name]  Procedure(s) Performed: Procedure(s) (LRB): ANTERIOR CERVICAL DECOMPRESSION/DISCECTOMY FUSION 1 LEVEL (N/A)  Anesthesia type: general  Patient location: PACU  Post pain: Pain level controlled  Post assessment: Patient's Cardiovascular Status Stable  Last Vitals:  Filed Vitals:   09/03/11 1215  BP: 147/89  Pulse: 82  Temp:   Resp: 10    Post vital signs: Reviewed and stable  Level of consciousness: sedated  Complications: No apparent anesthesia complications

## 2011-09-03 NOTE — Anesthesia Procedure Notes (Signed)
Procedure Name: Intubation Date/Time: 09/03/2011 7:48 AM Performed by: Julianne Rice K Pre-anesthesia Checklist: Emergency Drugs available, Patient identified, Timeout performed, Suction available and Patient being monitored Patient Re-evaluated:Patient Re-evaluated prior to inductionOxygen Delivery Method: Circle system utilized Preoxygenation: Pre-oxygenation with 100% oxygen Ventilation: Mask ventilation without difficulty Laryngoscope Size: Mac and 3 Grade View: Grade II Tube type: Oral Tube size: 8.0 mm Number of attempts: 1 Airway Equipment and Method: Stylet and LTA kit utilized Placement Confirmation: ETT inserted through vocal cords under direct vision,  positive ETCO2 and breath sounds checked- equal and bilateral Secured at: 24 cm Tube secured with: Tape Dental Injury: Teeth and Oropharynx as per pre-operative assessment

## 2011-09-03 NOTE — Anesthesia Preprocedure Evaluation (Addendum)
Anesthesia Evaluation  Patient identified by MRN, date of birth, ID band Patient awake    Reviewed: Allergy & Precautions, H&P , NPO status , Patient's Chart, lab work & pertinent test results  History of Anesthesia Complications (+) PONV  Airway       Dental   Pulmonary sleep apnea ,          Cardiovascular negative cardio ROS      Neuro/Psych  Headaches, negative psych ROS   GI/Hepatic Neg liver ROS, GERD-  Controlled and Medicated,Medicines prn   Endo/Other  negative endocrine ROS  Renal/GU negative Renal ROS     Musculoskeletal negative musculoskeletal ROS (+)   Abdominal   Peds  Hematology negative hematology ROS (+)   Anesthesia Other Findings   Reproductive/Obstetrics                          Anesthesia Physical Anesthesia Plan  ASA: II  Anesthesia Plan: General   Post-op Pain Management:    Induction: Intravenous  Airway Management Planned: Oral ETT  Additional Equipment:   Intra-op Plan:   Post-operative Plan: Extubation in OR  Informed Consent: I have reviewed the patients History and Physical, chart, labs and discussed the procedure including the risks, benefits and alternatives for the proposed anesthesia with the patient or authorized representative who has indicated his/her understanding and acceptance.   Dental advisory given  Plan Discussed with: Surgeon and Anesthesiologist  Anesthesia Plan Comments:         Anesthesia Quick Evaluation

## 2011-09-03 NOTE — Preoperative (Signed)
Beta Blockers   Reason not to administer Beta Blockers:Not Applicable 

## 2011-09-04 ENCOUNTER — Encounter (HOSPITAL_COMMUNITY): Payer: Self-pay | Admitting: Orthopedic Surgery

## 2011-09-04 MED ORDER — ONDANSETRON HCL 4 MG PO TABS: 4.0000 mg | ORAL_TABLET | Freq: Three times a day (TID) | ORAL | Status: AC | PRN

## 2011-09-04 MED ORDER — POLYETHYLENE GLYCOL 3350 17 G PO PACK: 17.0000 g | PACK | Freq: Every day | ORAL | Status: AC

## 2011-09-04 MED ORDER — OXYCODONE-ACETAMINOPHEN 10-325 MG PO TABS: 1.0000 | ORAL_TABLET | Freq: Four times a day (QID) | ORAL | Status: AC | PRN

## 2011-09-04 MED ORDER — METHOCARBAMOL 500 MG PO TABS: 500.0000 mg | ORAL_TABLET | Freq: Three times a day (TID) | ORAL | Status: AC

## 2011-09-04 NOTE — Progress Notes (Signed)
    Subjective: Procedure(s) (LRB): ANTERIOR CERVICAL DECOMPRESSION/DISCECTOMY FUSION 1 LEVEL (N/A) 1 Day Post-Op  Patient reports pain as 3 on 0-10 scale.  Reports decreased arm pain denies incisional neck pain   Positive void Negative bowel movement Positive flatus Negative chest pain or shortness of breath  Objective: Vital signs in last 24 hours: Temp:  [97 F (36.1 C)-98.2 F (36.8 C)] 98 F (36.7 C) (08/30 0521) Pulse Rate:  [63-89] 82  (08/30 0521) Resp:  [5-18] 17  (08/30 0521) BP: (115-168)/(71-97) 145/81 mmHg (08/30 0521) SpO2:  [96 %-100 %] 96 % (08/30 0521)  Intake/Output from previous day: 08/29 0701 - 08/30 0700 In: 3273.4 [P.O.:480; I.V.:2336.4; IV Piggyback:457] Out: 1850 [Urine:1800; Blood:50]  Labs: No results found for this basename: WBC:2,RBC:2,HCT:2,PLT:2 in the last 72 hours No results found for this basename: NA:2,K:2,CL:2,CO2:2,BUN:2,CREATININE:2,GLUCOSE:2,CALCIUM:2 in the last 72 hours No results found for this basename: LABPT:2,INR:2 in the last 72 hours  Physical Exam: Neurologically intact ABD soft Neurovascular intact Incision: dressing C/D/I, no drainage and no swelling at incision site Compartment soft  Assessment/Plan: Patient stable  xrays satisfactory Mobilization with physical therapy Encourage incentive spirometry Continue care  Advance diet Up with therapy D/C to home today  Venita Lick, MD [facility] [phone]

## 2011-09-04 NOTE — Progress Notes (Signed)
Patient discharged to home in care of spouse. Medications and instructions reviewed with patient and spouse with all questions answered. IV d/c'd with cath intact. Assessment unchanged from this am. Patient is to follow up with Dr. Shon Baton in 2 weeks.

## 2011-09-04 NOTE — Progress Notes (Signed)
CARE MANAGEMENT NOTE 09/04/2011  Patient:  Gary [name], Gary [name]   Account Number:  [id number]  Date Initiated:  09/04/2011  Documentation initiated by:  Vance Peper  Subjective/Objective Assessment:   56 yr old male s/p C 3-4 fusion     Action/Plan:   CM spoke with patient regarding needs at discharge. Patient is under worker's comp. concerned about getting meds. CM contacted Mariea Clonts, RN [phone]- states pt can go to his Pharmacy and fill.   Anticipated DC Date:  09/04/2011   Anticipated DC Plan:  HOME/SELF CARE      DC Planning Services  CM consult      PAC Choice  NA   Choice offered to / List presented to:             Status of service:  Completed, signed off Medicare Important Message given?   (If response is "NO", the following Medicare IM given date fields will be blank) Date Medicare IM given:   Date Additional Medicare IM given:    Discharge Disposition:  HOME/SELF CARE  Per UR Regulation:    If discussed at Long Length of Stay Meetings, dates discussed:    Comments:

## 2011-09-04 NOTE — Evaluation (Addendum)
Occupational Therapy Evaluation and Discharge  Patient Details Name: Gary [name] MRN: [id number] DOB: 08/06/1955 Today's Date: 09/04/2011 Time: 0840-0900 OT Time Calculation (min): 20 min  OT Assessment / Plan / Recommendation Clinical Impression  Pt admitted for cervical sx and presents near baseline functioning. All education completed including C-collar care/management, precautions and adaptive techniques to maintain precautions. No further acute OT indicated at this time. No DME needs    OT Assessment  Patient does not need any further OT services    Follow Up Recommendations  No OT follow up    Barriers to Discharge      Equipment Recommendations  None recommended by OT;None recommended by PT    Recommendations for Other Services    Frequency       Precautions / Restrictions Precautions Precautions: Cervical Precaution Comments: cervical precautions Required Braces or Orthoses: Cervical Brace Cervical Brace: Hard collar;Applied in sitting position Restrictions Weight Bearing Restrictions: No   Pertinent Vitals/Pain Pt reports 5/10 throat pain- premedicated    ADL  Eating/Feeding: Performed;Independent (instructed to doff collar for eating but maintain precaution) Where Assessed - Eating/Feeding: Chair Grooming: Performed;Supervision/safety Where Assessed - Grooming: Unsupported standing (VCs for adaptive tech to maintain precautions) Upper Body Dressing: Performed;Min guard Where Assessed - Upper Body Dressing: Unsupported sitting Lower Body Dressing: Performed;Supervision/safety Where Assessed - Lower Body Dressing: Unsupported sit to stand Toilet Transfer: Performed;Independent Toilet Transfer Method: Sit to Barista: Regular height toilet Toileting - Clothing Manipulation and Hygiene: Simulated;Independent Where Assessed - Toileting Clothing Manipulation and Hygiene: Standing Tub/Shower Transfer: Performed;Independent Tub/Shower  Transfer Method: Ambulating Equipment Used: Gait belt (C-collar) Transfers/Ambulation Related to ADLs: Ind with ambulation    OT Diagnosis:    OT Problem List:   OT Treatment Interventions:     OT Goals    Visit Information  Last OT Received On: 09/04/11 Assistance Needed: +1    Subjective Data  Subjective: I going home today. Patient Stated Goal: Return home   Prior Functioning  Vision/Perception  Home Living Lives With: Spouse Available Help at Discharge: Family Type of Home: House Home Access: Stairs to enter Secretary/administrator of Steps: 1 Entrance Stairs-Rails: None Home Layout: One level Bathroom Shower/Tub: Forensic scientist: Standard Bathroom Accessibility: Yes Home Adaptive Equipment: None Prior Function Level of Independence: Independent Able to Take Stairs?: Yes Driving: Yes Vocation: Workers Chief Operating Officer: No difficulties Dominant Hand: Right      Cognition  Overall Cognitive Status: Appears within functional limits for tasks assessed/performed Arousal/Alertness: Awake/alert Orientation Level: Appears intact for tasks assessed Behavior During Session: [facility] for tasks performed    Extremity/Trunk Assessment Right Upper Extremity Assessment RUE ROM/Strength/Tone: WFL for tasks assessed RUE Sensation: WFL - Light Touch RUE Coordination: WFL - gross/fine motor Left Upper Extremity Assessment LUE ROM/Strength/Tone: WFL for tasks assessed LUE Sensation: WFL - Light Touch LUE Coordination: WFL - gross/fine motor   Mobility  Shoulder Instructions  Bed Mobility Bed Mobility: Supine to Sit Supine to Sit: 6: Modified independent (Device/Increase time) Transfers Sit to Stand: 7: Independent;From bed Stand to Sit: 7: Independent;To chair/3-in-1       Exercise     Balance     End of Session OT - End of Session Equipment Utilized During Treatment: Gait belt Activity Tolerance: Patient tolerated  treatment well Patient left: in chair;with call bell/phone within reach;with family/visitor present Nurse Communication: Mobility status  GO Functional Assessment Tool Used: clinical observation Functional Limitation: Self care Self Care Current Status (W0981): At least 20  percent but less than 40 percent impaired, limited or restricted Self Care Goal Status (Z6109): At least 1 percent but less than 20 percent impaired, limited or restricted Self Care Discharge Status [phone]): At least 1 percent but less than 20 percent impaired, limited or restricted   Chellsea Beckers 09/04/2011, 2:57 PM

## 2011-09-04 NOTE — Progress Notes (Signed)
Referral received for SNF. Chart reviewed and CSW has spoken with RNCM. Patient will d/c home today per MD. Noc CSW needs identified.    CSW to sign off.  Gary [name]. Gary [name]  [phone]

## 2011-09-04 NOTE — Op Note (Signed)
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  OPERATIVE PROCEDURE:  Anterior cervical diskectomy and fusion, C3-4.  COMPLICATIONS:  None.  CONDITION:  Stable.  FIRST ASSISTANT:  Norval Gable, PATIENT.  INDICATIONS FOR SURGERY:  This is a very pleasant gentleman, who has been having severe progressive neck pain and left trapezial (C4) radicular pain.  Attempts at conservative management consisting of injection therapy, physical therapy, medications, and activity modification failed to alleviate his symptoms.  Ultimately, the patient had a facet block which provided the greatest amount of relief, albeit temporarily.  As a result of the ongoing severe pain, he elected to proceed with surgery.  All appropriate risks, benefits, and alternatives were discussed with the patient and his wife, and consent was obtained.  Instrumentation system used was a Titan titanium intervertebral cage, size 7 small packed with DBX mix and then a 12 anterior cervical Vectra plate with 16 mm screws into the body of C3 and 14 mm screws into the body of C4.  OPERATIVE NOTE:  The patient was brought to the operating room, placed supine on the operating table.  After successful induction of general anesthesia and endotracheal intubation, TEDs and SCDs were applied. Towels were placed between the shoulder blades.  The shoulders themselves were taped down.  The arms were secured at the side.  The anterior cervical spine was prepped and  draped in standard fashion. Appropriate time-out was then performed to confirm the patient, procedure, and all other pertinent important data.  Once this was completed, x-ray was brought into the wound sterilely and a lateral view, I identified the incision site.  I then made a small transverse incision approximately, 2 inches in length starting from the midline and proceeding to the left side.  Sharp dissection was carried out down to and through the platysma.  I then sharply dissected along the internervous plane between the sternocleidomastoid and the strap muscles.  I identified the omohyoid muscle, released it from its sling, and this allowed adequate retraction.  I then mobilized the remaining deep cervical and prevertebral fascia to expose the anterior longitudinal ligament and the anterior aspect of the spine.  An appendiceal retractor was placed to sweep the trachea and esophagus to the right and then I placed a needle into the 3-4 disk space.  X-rays were then taken to confirm that I was at the appropriate level.  Once this was done, I then began mobilizing the longus coli muscle using bipolar electrocautery from the midbody of C3 to the midbody of C4.  I then placed Caspar retracting blades underneath the longus coli muscle, deflated the endotracheal cuff and expanded the retractors to the appropriate width.  The cuff was then reinflated.  Distraction pins were placed  into the bodies of C3 and C4.  I gently distracted the disk space.  An annulotomy using a 15 blade scalpel was then performed and then using a combination of pituitary rongeurs, curettes, and Kerrison rongeurs, I resected all of the disk material.  I then developed a plane behind the vertebral body of C4 and used a nerve hook and 1 mm Kerrison to resect the remaining posterior anulus.  After releasing the anulus and resecting the osteophytes from the posterior aspect of the vertebral bodies, I rasped the  endplates to ensure I had bleeding bone.  At this point, I then placed a trial.  I used the Titan titanium trials as well as the Synthes 0 profile trials.  Ultimately I felt as though the contact that I got with the Titan titanium intervertebral cage would be better than that with the 0 profile Synthes cervical plate.  As such, I went with a Titan intervertebral cage.  I packed this and used a size 7 small lordotic and I had excellent fixation and interbody fit.  I then contoured a size 12 Vector plate and secured it through bone holes using 16 mm screws into the body of C3 and 14 mm screws into the body of C4. All screws were tightened appropriately and locked to the plate.  I irrigated the wound copiously with normal saline, removed the remaining retractors and checked to ensure the esophagus did not become entrapped beneath the plate.  The trachea and esophagus were placed back to midline and I again irrigated and ensured I had hemostasis.  Once this was confirmed, I closed the platysma with interrupted 2-0 Vicryl sutures and a 3-0 Monocryl for the skin.  Steri-Strips and dry dressing were applied.  The patient was ultimately extubated and transferred to the PACU without incident.  At the end of the case, all needle and sponge counts were correct.     Alvy Beal, MD     DDB/MEDQ  D:  09/03/2011  T:  09/04/2011  Job:  [id number]

## 2011-09-04 NOTE — Evaluation (Signed)
Physical Therapy Evaluation Patient Details Name: Gary [name] MRN: [id number] DOB: [birth year] Today's Date: 09/04/2011 Time: 0836-0900 PT Time Calculation (min): 24 min  PT Assessment / Plan / Recommendation Clinical Impression  Pt is 56 y/o male admitted for s/p ACDF.  Pt moving independently and has no further PT needs.  PT will sign off.  All education complete.    PT Assessment  Patent does not need any further PT services    Follow Up Recommendations  No PT follow up    Barriers to Discharge        Equipment Recommendations  None recommended by PT    Recommendations for Other Services     Frequency      Precautions / Restrictions Precautions Precautions: Cervical Precaution Comments: cervical precautions Required Braces or Orthoses: Cervical Brace Cervical Brace: Hard collar;Applied in sitting position Restrictions Weight Bearing Restrictions: No   Pertinent Vitals/Pain C/o throat soreness but no c/o pain      Mobility  Bed Mobility Bed Mobility: Supine to Sit Supine to Sit: 6: Modified independent (Device/Increase time) Transfers Transfers: Sit to Stand;Stand to Sit Sit to Stand: 7: Independent;From bed Stand to Sit: 7: Independent;To chair/3-in-1 Ambulation/Gait Ambulation/Gait Assistance: 7: Independent Ambulation Distance (Feet): 200 Feet Assistive device: None Stairs: Yes Stairs Assistance: 7: Independent Stair Management Technique: No rails Number of Stairs: 1  (*3)    Exercises     PT Diagnosis:    PT Problem List:   PT Treatment Interventions:     PT Goals    Visit Information  Last PT Received On: 09/04/11 Assistance Needed: +1 PT/OT Co-Evaluation/Treatment: Yes    Subjective Data  Subjective: "I'm ready to go home today." Patient Stated Goal: To go home   Prior Functioning  Home Living Lives With: Spouse Available Help at Discharge: Family Type of Home: House Home Access: Stairs to enter Secretary/administrator of  Steps: 1 Entrance Stairs-Rails: None Home Layout: One level Bathroom Shower/Tub: Forensic scientist: Standard Bathroom Accessibility: Yes Home Adaptive Equipment: None Prior Function Level of Independence: Independent Able to Take Stairs?: Yes Driving: Yes Vocation: Workers Chief Operating Officer: No difficulties Dominant Hand: Right    Cognition  Overall Cognitive Status: Appears within functional limits for tasks assessed/performed Arousal/Alertness: Awake/alert Orientation Level: Appears intact for tasks assessed Behavior During Session: [facility] for tasks performed    Extremity/Trunk Assessment Right Lower Extremity Assessment RLE ROM/Strength/Tone: Within functional levels RLE Sensation: WFL - Light Touch Left Lower Extremity Assessment LLE ROM/Strength/Tone: Within functional levels LLE Sensation: WFL - Light Touch   Balance    End of Session PT - End of Session Equipment Utilized During Treatment: Gait belt;Cervical collar Activity Tolerance: Patient tolerated treatment well Patient left: in chair;with call bell/phone within reach;with family/visitor present Nurse Communication: Mobility status  GP     Gary [name] 09/04/2011, 10:10 AM [name], PT DPT [phone]

## 2011-12-24 ENCOUNTER — Other Ambulatory Visit: Payer: Self-pay | Admitting: Otolaryngology

## 2011-12-24 DIAGNOSIS — R1310 Dysphagia, unspecified: Secondary | ICD-10-CM

## 2011-12-28 ENCOUNTER — Ambulatory Visit
Admission: RE | Admit: 2011-12-28 | Discharge: 2011-12-28 | Disposition: A | Discharge status: Home / Self Care | Payer: Worker's Compensation | Source: Ambulatory Visit | Attending: Otolaryngology | Admitting: Otolaryngology

## 2011-12-28 DIAGNOSIS — R1310 Dysphagia, unspecified: Secondary | ICD-10-CM

## 2012-03-14 ENCOUNTER — Ambulatory Visit (INDEPENDENT_AMBULATORY_CARE_PROVIDER_SITE_OTHER): Payer: Worker's Compensation

## 2012-03-14 ENCOUNTER — Other Ambulatory Visit: Payer: Self-pay | Admitting: Orthopedic Surgery

## 2012-03-14 DIAGNOSIS — M542 Cervicalgia: Secondary | ICD-10-CM

## 2013-09-09 IMAGING — CR DG CERVICAL SPINE 2 OR 3 VIEWS
2 series · 2 of 2 positions shown · non-contrast
Comparison: None
Correlation:  MRI cervical spine 04/12/2036

CLINICAL DATA: Preoperative assessment for anterior cervical
decompression and discectomy with fusion C3-C4

CERVICAL SPINE - 2-3 VIEW

[view not recorded (1 of 2)]
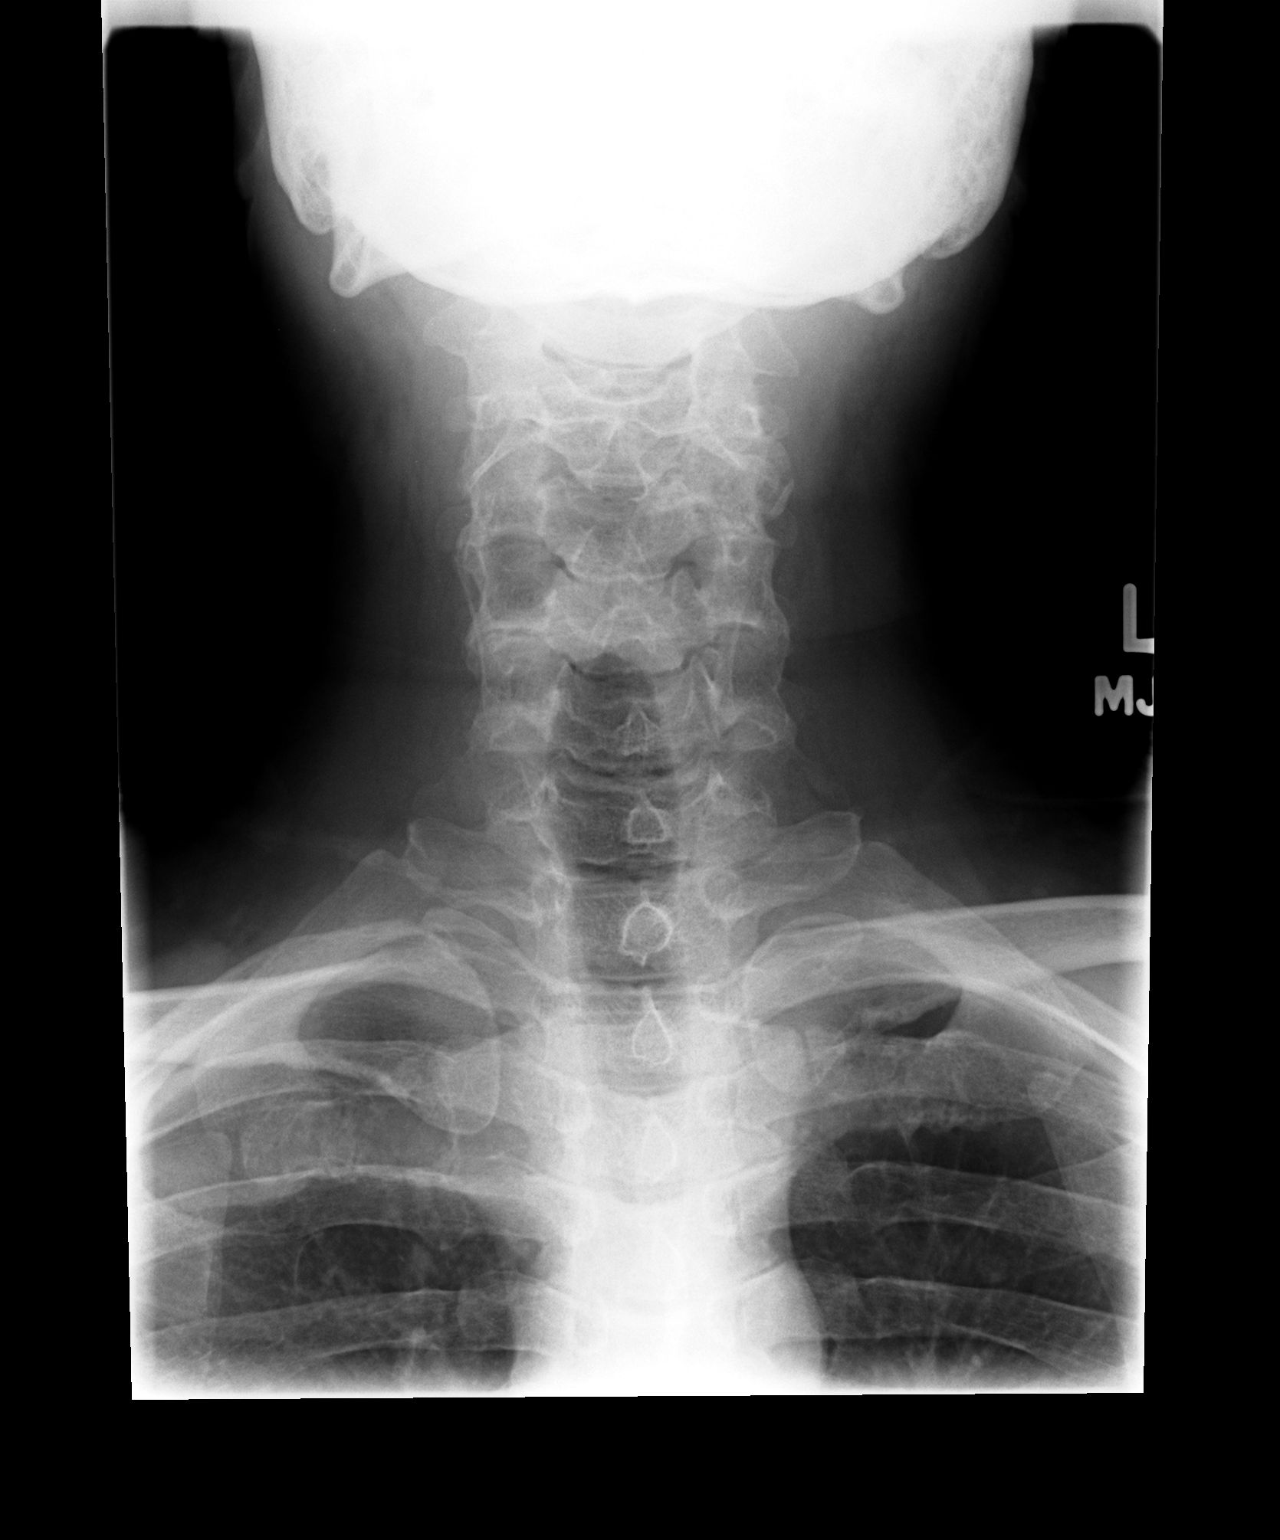

[view not recorded (2 of 2)]
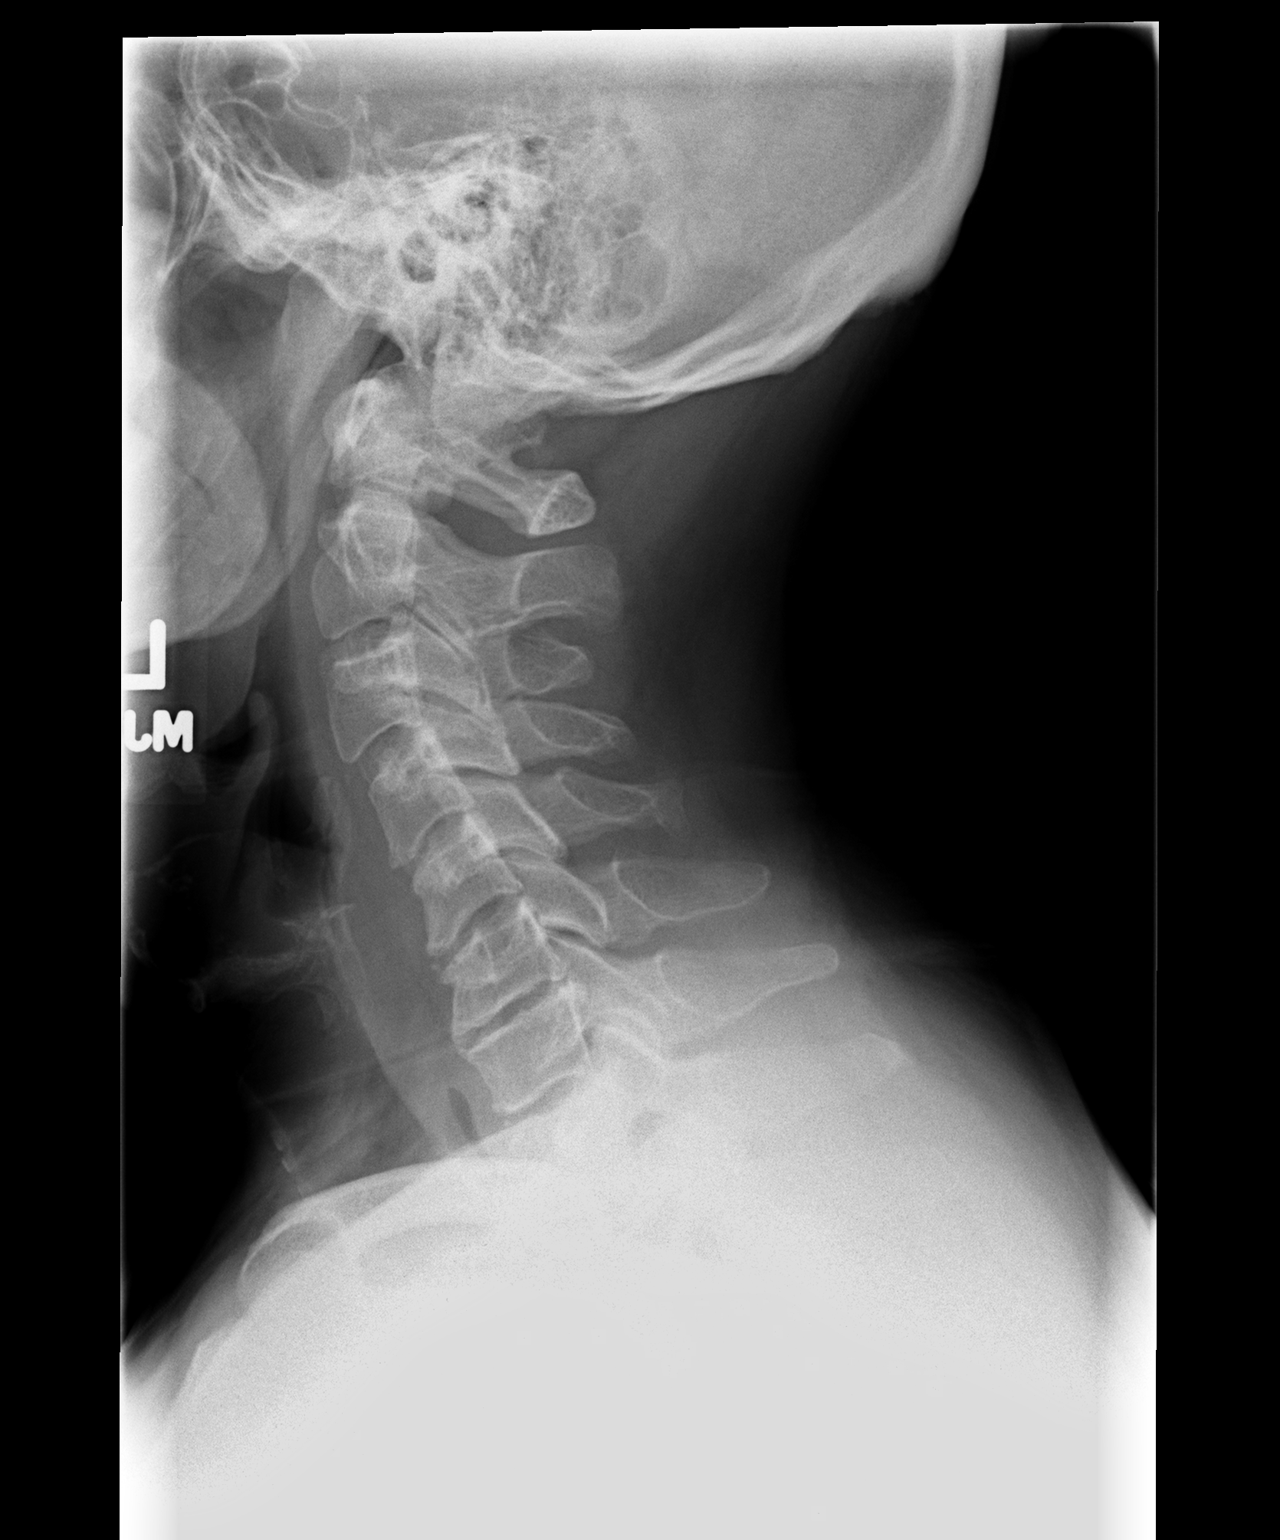

[2 of 2 positions shown; findings below may reference images not displayed]

FINDINGS: Prevertebral soft tissues normal thickness.
Disc space narrowing at C4-C5 through C6-C7 with endplate spur
formation greatest at C6-C7.
Facet degenerative changes greatest at left C3-C4.
Vertebral body heights maintained without fracture or subluxation.
Question 7 mm left upper lobe nodule.
Osseous mineralization grossly normal.
IMPRESSION: Degenerative disc disease changes of the cervical spine at C4-C5
through C6-C7.
Scattered facet degenerative changes greatest at left C3-C4.
Question 7 mm left upper lobe pulmonary nodule; CT chest
recommended to exclude pulmonary nodule.

These results will be called to the ordering clinician or
representative by the Radiologist Assistant, and communication
documented in the PACS Dashboard.

## 2015-06-13 DIAGNOSIS — G529 Cranial nerve disorder, unspecified: Secondary | ICD-10-CM | POA: Diagnosis not present

## 2015-06-13 DIAGNOSIS — M791 Myalgia: Secondary | ICD-10-CM | POA: Diagnosis not present

## 2015-06-13 DIAGNOSIS — G894 Chronic pain syndrome: Secondary | ICD-10-CM | POA: Diagnosis not present

## 2015-06-13 DIAGNOSIS — M1990 Unspecified osteoarthritis, unspecified site: Secondary | ICD-10-CM | POA: Diagnosis not present

## 2015-06-13 DIAGNOSIS — M961 Postlaminectomy syndrome, not elsewhere classified: Secondary | ICD-10-CM | POA: Diagnosis not present

## 2015-07-18 DIAGNOSIS — K219 Gastro-esophageal reflux disease without esophagitis: Secondary | ICD-10-CM | POA: Diagnosis not present

## 2015-07-18 DIAGNOSIS — R079 Chest pain, unspecified: Secondary | ICD-10-CM | POA: Diagnosis not present

## 2015-07-18 DIAGNOSIS — G8929 Other chronic pain: Secondary | ICD-10-CM | POA: Diagnosis not present

## 2015-07-18 DIAGNOSIS — R1013 Epigastric pain: Secondary | ICD-10-CM | POA: Diagnosis not present

## 2015-07-18 DIAGNOSIS — Z23 Encounter for immunization: Secondary | ICD-10-CM | POA: Diagnosis not present

## 2015-07-18 DIAGNOSIS — M1250 Traumatic arthropathy, unspecified site: Secondary | ICD-10-CM | POA: Diagnosis not present

## 2015-07-18 DIAGNOSIS — A09 Infectious gastroenteritis and colitis, unspecified: Secondary | ICD-10-CM | POA: Diagnosis not present

## 2015-07-18 DIAGNOSIS — R0602 Shortness of breath: Secondary | ICD-10-CM | POA: Diagnosis not present

## 2015-07-18 DIAGNOSIS — Z79899 Other long term (current) drug therapy: Secondary | ICD-10-CM | POA: Diagnosis not present

## 2015-07-18 DIAGNOSIS — R200 Anesthesia of skin: Secondary | ICD-10-CM | POA: Diagnosis not present

## 2015-07-18 DIAGNOSIS — R918 Other nonspecific abnormal finding of lung field: Secondary | ICD-10-CM | POA: Diagnosis not present

## 2015-07-19 DIAGNOSIS — R918 Other nonspecific abnormal finding of lung field: Secondary | ICD-10-CM | POA: Diagnosis not present

## 2015-07-19 DIAGNOSIS — K219 Gastro-esophageal reflux disease without esophagitis: Secondary | ICD-10-CM | POA: Diagnosis not present

## 2015-07-19 DIAGNOSIS — A09 Infectious gastroenteritis and colitis, unspecified: Secondary | ICD-10-CM | POA: Diagnosis not present

## 2015-07-19 DIAGNOSIS — R079 Chest pain, unspecified: Secondary | ICD-10-CM | POA: Diagnosis not present

## 2015-07-20 DIAGNOSIS — R079 Chest pain, unspecified: Secondary | ICD-10-CM | POA: Diagnosis not present

## 2015-07-20 DIAGNOSIS — A09 Infectious gastroenteritis and colitis, unspecified: Secondary | ICD-10-CM | POA: Diagnosis not present

## 2015-07-20 DIAGNOSIS — R918 Other nonspecific abnormal finding of lung field: Secondary | ICD-10-CM | POA: Diagnosis not present

## 2015-07-20 DIAGNOSIS — K219 Gastro-esophageal reflux disease without esophagitis: Secondary | ICD-10-CM | POA: Diagnosis not present

## 2015-08-19 DIAGNOSIS — G894 Chronic pain syndrome: Secondary | ICD-10-CM | POA: Diagnosis not present

## 2015-08-19 DIAGNOSIS — M791 Myalgia: Secondary | ICD-10-CM | POA: Diagnosis not present

## 2015-08-19 DIAGNOSIS — G529 Cranial nerve disorder, unspecified: Secondary | ICD-10-CM | POA: Diagnosis not present

## 2015-08-19 DIAGNOSIS — M1990 Unspecified osteoarthritis, unspecified site: Secondary | ICD-10-CM | POA: Diagnosis not present

## 2015-08-19 DIAGNOSIS — M961 Postlaminectomy syndrome, not elsewhere classified: Secondary | ICD-10-CM | POA: Diagnosis not present

## 2015-11-20 DIAGNOSIS — M791 Myalgia: Secondary | ICD-10-CM | POA: Diagnosis not present

## 2015-11-20 DIAGNOSIS — M961 Postlaminectomy syndrome, not elsewhere classified: Secondary | ICD-10-CM | POA: Diagnosis not present

## 2015-11-20 DIAGNOSIS — G529 Cranial nerve disorder, unspecified: Secondary | ICD-10-CM | POA: Diagnosis not present

## 2015-11-20 DIAGNOSIS — M1990 Unspecified osteoarthritis, unspecified site: Secondary | ICD-10-CM | POA: Diagnosis not present

## 2015-11-20 DIAGNOSIS — G894 Chronic pain syndrome: Secondary | ICD-10-CM | POA: Diagnosis not present

## 2015-11-20 DIAGNOSIS — Z79891 Long term (current) use of opiate analgesic: Secondary | ICD-10-CM | POA: Diagnosis not present

## 2016-01-28 DIAGNOSIS — Z79891 Long term (current) use of opiate analgesic: Secondary | ICD-10-CM | POA: Diagnosis not present

## 2016-01-28 DIAGNOSIS — M791 Myalgia: Secondary | ICD-10-CM | POA: Diagnosis not present

## 2016-01-28 DIAGNOSIS — M1990 Unspecified osteoarthritis, unspecified site: Secondary | ICD-10-CM | POA: Diagnosis not present

## 2016-01-28 DIAGNOSIS — G894 Chronic pain syndrome: Secondary | ICD-10-CM | POA: Diagnosis not present

## 2016-01-28 DIAGNOSIS — M961 Postlaminectomy syndrome, not elsewhere classified: Secondary | ICD-10-CM | POA: Diagnosis not present

## 2016-01-28 DIAGNOSIS — G529 Cranial nerve disorder, unspecified: Secondary | ICD-10-CM | POA: Diagnosis not present

## 2016-03-23 DIAGNOSIS — Z79891 Long term (current) use of opiate analgesic: Secondary | ICD-10-CM | POA: Diagnosis not present

## 2016-03-23 DIAGNOSIS — M791 Myalgia: Secondary | ICD-10-CM | POA: Diagnosis not present

## 2016-03-23 DIAGNOSIS — M1990 Unspecified osteoarthritis, unspecified site: Secondary | ICD-10-CM | POA: Diagnosis not present

## 2016-03-23 DIAGNOSIS — G529 Cranial nerve disorder, unspecified: Secondary | ICD-10-CM | POA: Diagnosis not present

## 2016-03-23 DIAGNOSIS — M961 Postlaminectomy syndrome, not elsewhere classified: Secondary | ICD-10-CM | POA: Diagnosis not present

## 2016-03-23 DIAGNOSIS — G894 Chronic pain syndrome: Secondary | ICD-10-CM | POA: Diagnosis not present

## 2016-05-21 DIAGNOSIS — G529 Cranial nerve disorder, unspecified: Secondary | ICD-10-CM | POA: Diagnosis not present

## 2016-05-21 DIAGNOSIS — G894 Chronic pain syndrome: Secondary | ICD-10-CM | POA: Diagnosis not present

## 2016-05-21 DIAGNOSIS — M961 Postlaminectomy syndrome, not elsewhere classified: Secondary | ICD-10-CM | POA: Diagnosis not present

## 2016-05-21 DIAGNOSIS — M1990 Unspecified osteoarthritis, unspecified site: Secondary | ICD-10-CM | POA: Diagnosis not present

## 2016-05-21 DIAGNOSIS — M791 Myalgia: Secondary | ICD-10-CM | POA: Diagnosis not present

## 2016-05-21 DIAGNOSIS — Z79891 Long term (current) use of opiate analgesic: Secondary | ICD-10-CM | POA: Diagnosis not present

## 2016-07-15 DIAGNOSIS — Z79891 Long term (current) use of opiate analgesic: Secondary | ICD-10-CM | POA: Diagnosis not present

## 2016-07-15 DIAGNOSIS — G894 Chronic pain syndrome: Secondary | ICD-10-CM | POA: Diagnosis not present

## 2016-07-15 DIAGNOSIS — M791 Myalgia: Secondary | ICD-10-CM | POA: Diagnosis not present

## 2016-07-15 DIAGNOSIS — G529 Cranial nerve disorder, unspecified: Secondary | ICD-10-CM | POA: Diagnosis not present

## 2016-07-15 DIAGNOSIS — M961 Postlaminectomy syndrome, not elsewhere classified: Secondary | ICD-10-CM | POA: Diagnosis not present

## 2016-07-15 DIAGNOSIS — M1990 Unspecified osteoarthritis, unspecified site: Secondary | ICD-10-CM | POA: Diagnosis not present

## 2016-09-10 DIAGNOSIS — M79671 Pain in right foot: Secondary | ICD-10-CM | POA: Diagnosis not present

## 2016-09-11 DIAGNOSIS — M76821 Posterior tibial tendinitis, right leg: Secondary | ICD-10-CM | POA: Diagnosis not present

## 2016-09-17 DIAGNOSIS — M791 Myalgia: Secondary | ICD-10-CM | POA: Diagnosis not present

## 2016-09-17 DIAGNOSIS — G529 Cranial nerve disorder, unspecified: Secondary | ICD-10-CM | POA: Diagnosis not present

## 2016-09-17 DIAGNOSIS — Z79891 Long term (current) use of opiate analgesic: Secondary | ICD-10-CM | POA: Diagnosis not present

## 2016-09-17 DIAGNOSIS — M961 Postlaminectomy syndrome, not elsewhere classified: Secondary | ICD-10-CM | POA: Diagnosis not present

## 2016-09-17 DIAGNOSIS — M1990 Unspecified osteoarthritis, unspecified site: Secondary | ICD-10-CM | POA: Diagnosis not present

## 2016-09-17 DIAGNOSIS — G894 Chronic pain syndrome: Secondary | ICD-10-CM | POA: Diagnosis not present

## 2016-11-30 DIAGNOSIS — G529 Cranial nerve disorder, unspecified: Secondary | ICD-10-CM | POA: Diagnosis not present

## 2016-11-30 DIAGNOSIS — M791 Myalgia, unspecified site: Secondary | ICD-10-CM | POA: Diagnosis not present

## 2016-11-30 DIAGNOSIS — M1990 Unspecified osteoarthritis, unspecified site: Secondary | ICD-10-CM | POA: Diagnosis not present

## 2016-11-30 DIAGNOSIS — Z79891 Long term (current) use of opiate analgesic: Secondary | ICD-10-CM | POA: Diagnosis not present

## 2016-11-30 DIAGNOSIS — M961 Postlaminectomy syndrome, not elsewhere classified: Secondary | ICD-10-CM | POA: Diagnosis not present

## 2016-11-30 DIAGNOSIS — G894 Chronic pain syndrome: Secondary | ICD-10-CM | POA: Diagnosis not present

## 2016-12-07 DIAGNOSIS — Z13220 Encounter for screening for lipoid disorders: Secondary | ICD-10-CM | POA: Diagnosis not present

## 2016-12-07 DIAGNOSIS — K219 Gastro-esophageal reflux disease without esophagitis: Secondary | ICD-10-CM | POA: Diagnosis not present

## 2016-12-07 DIAGNOSIS — G43709 Chronic migraine without aura, not intractable, without status migrainosus: Secondary | ICD-10-CM | POA: Diagnosis not present

## 2016-12-07 DIAGNOSIS — Z23 Encounter for immunization: Secondary | ICD-10-CM | POA: Diagnosis not present

## 2016-12-07 DIAGNOSIS — G4730 Sleep apnea, unspecified: Secondary | ICD-10-CM | POA: Diagnosis not present

## 2016-12-23 DIAGNOSIS — I2510 Atherosclerotic heart disease of native coronary artery without angina pectoris: Secondary | ICD-10-CM | POA: Diagnosis not present

## 2016-12-23 DIAGNOSIS — Z13220 Encounter for screening for lipoid disorders: Secondary | ICD-10-CM | POA: Diagnosis not present

## 2016-12-23 DIAGNOSIS — Z131 Encounter for screening for diabetes mellitus: Secondary | ICD-10-CM | POA: Diagnosis not present

## 2016-12-23 DIAGNOSIS — I10 Essential (primary) hypertension: Secondary | ICD-10-CM | POA: Diagnosis not present

## 2017-02-01 DIAGNOSIS — G894 Chronic pain syndrome: Secondary | ICD-10-CM | POA: Diagnosis not present

## 2017-02-01 DIAGNOSIS — Z79891 Long term (current) use of opiate analgesic: Secondary | ICD-10-CM | POA: Diagnosis not present

## 2017-02-01 DIAGNOSIS — M791 Myalgia, unspecified site: Secondary | ICD-10-CM | POA: Diagnosis not present

## 2017-02-01 DIAGNOSIS — M961 Postlaminectomy syndrome, not elsewhere classified: Secondary | ICD-10-CM | POA: Diagnosis not present

## 2017-02-01 DIAGNOSIS — G529 Cranial nerve disorder, unspecified: Secondary | ICD-10-CM | POA: Diagnosis not present

## 2017-02-01 DIAGNOSIS — M1990 Unspecified osteoarthritis, unspecified site: Secondary | ICD-10-CM | POA: Diagnosis not present

## 2017-02-11 DIAGNOSIS — G4731 Primary central sleep apnea: Secondary | ICD-10-CM | POA: Diagnosis not present

## 2017-02-11 DIAGNOSIS — G4719 Other hypersomnia: Secondary | ICD-10-CM | POA: Diagnosis not present

## 2017-02-11 DIAGNOSIS — R918 Other nonspecific abnormal finding of lung field: Secondary | ICD-10-CM | POA: Diagnosis not present

## 2017-02-25 DIAGNOSIS — G522 Disorders of vagus nerve: Secondary | ICD-10-CM | POA: Diagnosis not present

## 2017-02-25 DIAGNOSIS — M961 Postlaminectomy syndrome, not elsewhere classified: Secondary | ICD-10-CM | POA: Diagnosis not present

## 2017-02-25 DIAGNOSIS — M1990 Unspecified osteoarthritis, unspecified site: Secondary | ICD-10-CM | POA: Diagnosis not present

## 2017-03-19 DIAGNOSIS — R918 Other nonspecific abnormal finding of lung field: Secondary | ICD-10-CM | POA: Diagnosis not present

## 2017-03-19 DIAGNOSIS — R911 Solitary pulmonary nodule: Secondary | ICD-10-CM | POA: Diagnosis not present

## 2017-03-19 DIAGNOSIS — D71 Functional disorders of polymorphonuclear neutrophils: Secondary | ICD-10-CM | POA: Diagnosis not present

## 2017-04-12 DIAGNOSIS — M791 Myalgia, unspecified site: Secondary | ICD-10-CM | POA: Diagnosis not present

## 2017-04-12 DIAGNOSIS — E782 Mixed hyperlipidemia: Secondary | ICD-10-CM | POA: Diagnosis not present

## 2017-04-12 DIAGNOSIS — M961 Postlaminectomy syndrome, not elsewhere classified: Secondary | ICD-10-CM | POA: Diagnosis not present

## 2017-04-12 DIAGNOSIS — Z79891 Long term (current) use of opiate analgesic: Secondary | ICD-10-CM | POA: Diagnosis not present

## 2017-04-12 DIAGNOSIS — M1990 Unspecified osteoarthritis, unspecified site: Secondary | ICD-10-CM | POA: Diagnosis not present

## 2017-04-12 DIAGNOSIS — I10 Essential (primary) hypertension: Secondary | ICD-10-CM | POA: Diagnosis not present

## 2017-04-12 DIAGNOSIS — G894 Chronic pain syndrome: Secondary | ICD-10-CM | POA: Diagnosis not present

## 2017-04-12 DIAGNOSIS — G529 Cranial nerve disorder, unspecified: Secondary | ICD-10-CM | POA: Diagnosis not present

## 2017-04-13 DIAGNOSIS — G4733 Obstructive sleep apnea (adult) (pediatric): Secondary | ICD-10-CM | POA: Diagnosis not present

## 2017-05-20 DIAGNOSIS — G4739 Other sleep apnea: Secondary | ICD-10-CM | POA: Diagnosis not present

## 2017-06-30 DIAGNOSIS — M1990 Unspecified osteoarthritis, unspecified site: Secondary | ICD-10-CM | POA: Diagnosis not present

## 2017-06-30 DIAGNOSIS — Z79891 Long term (current) use of opiate analgesic: Secondary | ICD-10-CM | POA: Diagnosis not present

## 2017-06-30 DIAGNOSIS — G894 Chronic pain syndrome: Secondary | ICD-10-CM | POA: Diagnosis not present

## 2017-06-30 DIAGNOSIS — M791 Myalgia, unspecified site: Secondary | ICD-10-CM | POA: Diagnosis not present

## 2017-06-30 DIAGNOSIS — G529 Cranial nerve disorder, unspecified: Secondary | ICD-10-CM | POA: Diagnosis not present

## 2017-06-30 DIAGNOSIS — M961 Postlaminectomy syndrome, not elsewhere classified: Secondary | ICD-10-CM | POA: Diagnosis not present

## 2017-07-18 DIAGNOSIS — R0789 Other chest pain: Secondary | ICD-10-CM | POA: Diagnosis not present

## 2017-07-18 DIAGNOSIS — R079 Chest pain, unspecified: Secondary | ICD-10-CM | POA: Diagnosis not present

## 2017-07-18 DIAGNOSIS — K56609 Unspecified intestinal obstruction, unspecified as to partial versus complete obstruction: Secondary | ICD-10-CM | POA: Diagnosis not present

## 2017-07-18 DIAGNOSIS — J449 Chronic obstructive pulmonary disease, unspecified: Secondary | ICD-10-CM | POA: Diagnosis not present

## 2017-07-18 DIAGNOSIS — Z91041 Radiographic dye allergy status: Secondary | ICD-10-CM | POA: Diagnosis not present

## 2017-07-18 DIAGNOSIS — Z79899 Other long term (current) drug therapy: Secondary | ICD-10-CM | POA: Diagnosis not present

## 2017-07-18 DIAGNOSIS — K760 Fatty (change of) liver, not elsewhere classified: Secondary | ICD-10-CM | POA: Diagnosis not present

## 2017-07-18 DIAGNOSIS — K56600 Partial intestinal obstruction, unspecified as to cause: Secondary | ICD-10-CM | POA: Diagnosis not present

## 2017-07-18 DIAGNOSIS — R1111 Vomiting without nausea: Secondary | ICD-10-CM | POA: Diagnosis not present

## 2017-07-21 ENCOUNTER — Other Ambulatory Visit: Payer: Self-pay

## 2017-07-21 NOTE — Patient Outreach (Signed)
[facility] Care Management  07/21/2017  Gary [name] 03/07/1955 [id number]     Transition of Care Referral  Referral Date: 07/21/17 Referral Source: [facility] Discharge Report Date of Admission: unknown Diagnosis: unknown Date of Discharge: 07/20/17 Facility: [facility] Insurance: [facility]    Referral received. No outreach warranted at this time. TOC will be completed by primary care provider office( Dr. Dina Richobert Dough) who [facility].    Plan: RN CM will close case at this time.   Antionette Fairyoshanda Anaissa Macfadden, RN,BSN,CCM [facility] Care [facility] Telephonic Care Management Coordinator Direct Phone: [phone] Toll Free: [phone] Fax: [phone]

## 2017-07-26 DIAGNOSIS — Z91041 Radiographic dye allergy status: Secondary | ICD-10-CM | POA: Diagnosis not present

## 2017-07-26 DIAGNOSIS — Z452 Encounter for adjustment and management of vascular access device: Secondary | ICD-10-CM | POA: Diagnosis not present

## 2017-07-26 DIAGNOSIS — N5089 Other specified disorders of the male genital organs: Secondary | ICD-10-CM | POA: Diagnosis not present

## 2017-07-26 DIAGNOSIS — K56609 Unspecified intestinal obstruction, unspecified as to partial versus complete obstruction: Secondary | ICD-10-CM | POA: Diagnosis not present

## 2017-07-26 DIAGNOSIS — M1990 Unspecified osteoarthritis, unspecified site: Secondary | ICD-10-CM | POA: Diagnosis not present

## 2017-07-26 DIAGNOSIS — I10 Essential (primary) hypertension: Secondary | ICD-10-CM | POA: Diagnosis not present

## 2017-07-26 DIAGNOSIS — R1111 Vomiting without nausea: Secondary | ICD-10-CM | POA: Diagnosis not present

## 2017-07-26 DIAGNOSIS — K5652 Intestinal adhesions [bands] with complete obstruction: Secondary | ICD-10-CM | POA: Diagnosis not present

## 2017-07-26 DIAGNOSIS — J99 Respiratory disorders in diseases classified elsewhere: Secondary | ICD-10-CM | POA: Diagnosis not present

## 2017-07-26 DIAGNOSIS — J9811 Atelectasis: Secondary | ICD-10-CM | POA: Diagnosis not present

## 2017-07-26 DIAGNOSIS — N4889 Other specified disorders of penis: Secondary | ICD-10-CM | POA: Diagnosis not present

## 2017-07-26 DIAGNOSIS — G8918 Other acute postprocedural pain: Secondary | ICD-10-CM | POA: Diagnosis not present

## 2017-07-26 DIAGNOSIS — Z79899 Other long term (current) drug therapy: Secondary | ICD-10-CM | POA: Diagnosis not present

## 2017-07-26 DIAGNOSIS — K567 Ileus, unspecified: Secondary | ICD-10-CM | POA: Diagnosis not present

## 2017-07-26 DIAGNOSIS — R1084 Generalized abdominal pain: Secondary | ICD-10-CM | POA: Diagnosis not present

## 2017-08-18 DIAGNOSIS — M791 Myalgia, unspecified site: Secondary | ICD-10-CM | POA: Diagnosis not present

## 2017-08-18 DIAGNOSIS — G894 Chronic pain syndrome: Secondary | ICD-10-CM | POA: Diagnosis not present

## 2017-08-18 DIAGNOSIS — Z79891 Long term (current) use of opiate analgesic: Secondary | ICD-10-CM | POA: Diagnosis not present

## 2017-08-18 DIAGNOSIS — G529 Cranial nerve disorder, unspecified: Secondary | ICD-10-CM | POA: Diagnosis not present

## 2017-08-18 DIAGNOSIS — M1990 Unspecified osteoarthritis, unspecified site: Secondary | ICD-10-CM | POA: Diagnosis not present

## 2017-08-18 DIAGNOSIS — M961 Postlaminectomy syndrome, not elsewhere classified: Secondary | ICD-10-CM | POA: Diagnosis not present

## 2017-09-03 DIAGNOSIS — K56609 Unspecified intestinal obstruction, unspecified as to partial versus complete obstruction: Secondary | ICD-10-CM | POA: Diagnosis not present

## 2017-09-03 DIAGNOSIS — K219 Gastro-esophageal reflux disease without esophagitis: Secondary | ICD-10-CM | POA: Diagnosis not present

## 2017-09-03 DIAGNOSIS — T801XXA Vascular complications following infusion, transfusion and therapeutic injection, initial encounter: Secondary | ICD-10-CM | POA: Diagnosis not present

## 2017-09-03 DIAGNOSIS — K660 Peritoneal adhesions (postprocedural) (postinfection): Secondary | ICD-10-CM | POA: Diagnosis not present

## 2017-09-03 DIAGNOSIS — I809 Phlebitis and thrombophlebitis of unspecified site: Secondary | ICD-10-CM | POA: Diagnosis not present

## 2017-09-04 DIAGNOSIS — G4739 Other sleep apnea: Secondary | ICD-10-CM | POA: Diagnosis not present

## 2017-09-04 DIAGNOSIS — M5481 Occipital neuralgia: Secondary | ICD-10-CM | POA: Diagnosis not present

## 2017-09-04 DIAGNOSIS — G4733 Obstructive sleep apnea (adult) (pediatric): Secondary | ICD-10-CM | POA: Diagnosis not present

## 2017-09-04 DIAGNOSIS — Z9989 Dependence on other enabling machines and devices: Secondary | ICD-10-CM | POA: Diagnosis not present

## 2017-10-26 DIAGNOSIS — M1712 Unilateral primary osteoarthritis, left knee: Secondary | ICD-10-CM | POA: Diagnosis not present

## 2017-10-26 DIAGNOSIS — M25562 Pain in left knee: Secondary | ICD-10-CM | POA: Diagnosis not present

## 2017-10-28 DIAGNOSIS — M25562 Pain in left knee: Secondary | ICD-10-CM | POA: Diagnosis not present

## 2017-10-28 DIAGNOSIS — G4733 Obstructive sleep apnea (adult) (pediatric): Secondary | ICD-10-CM | POA: Diagnosis not present

## 2017-10-29 DIAGNOSIS — G43709 Chronic migraine without aura, not intractable, without status migrainosus: Secondary | ICD-10-CM | POA: Diagnosis not present

## 2017-10-29 DIAGNOSIS — E782 Mixed hyperlipidemia: Secondary | ICD-10-CM | POA: Diagnosis not present

## 2017-10-29 DIAGNOSIS — Z131 Encounter for screening for diabetes mellitus: Secondary | ICD-10-CM | POA: Diagnosis not present

## 2017-10-29 DIAGNOSIS — I10 Essential (primary) hypertension: Secondary | ICD-10-CM | POA: Diagnosis not present

## 2017-10-29 DIAGNOSIS — Z23 Encounter for immunization: Secondary | ICD-10-CM | POA: Diagnosis not present

## 2017-10-29 DIAGNOSIS — I2510 Atherosclerotic heart disease of native coronary artery without angina pectoris: Secondary | ICD-10-CM | POA: Diagnosis not present

## 2017-10-30 DIAGNOSIS — S83282A Other tear of lateral meniscus, current injury, left knee, initial encounter: Secondary | ICD-10-CM | POA: Diagnosis not present

## 2017-10-30 DIAGNOSIS — M25562 Pain in left knee: Secondary | ICD-10-CM | POA: Diagnosis not present

## 2017-11-03 DIAGNOSIS — M25562 Pain in left knee: Secondary | ICD-10-CM | POA: Diagnosis not present

## 2017-11-03 DIAGNOSIS — S83242A Other tear of medial meniscus, current injury, left knee, initial encounter: Secondary | ICD-10-CM | POA: Diagnosis not present

## 2017-11-03 DIAGNOSIS — M1712 Unilateral primary osteoarthritis, left knee: Secondary | ICD-10-CM | POA: Diagnosis not present

## 2017-11-04 DIAGNOSIS — G4739 Other sleep apnea: Secondary | ICD-10-CM | POA: Diagnosis not present

## 2017-11-08 DIAGNOSIS — Z79891 Long term (current) use of opiate analgesic: Secondary | ICD-10-CM | POA: Diagnosis not present

## 2017-11-08 DIAGNOSIS — M1990 Unspecified osteoarthritis, unspecified site: Secondary | ICD-10-CM | POA: Diagnosis not present

## 2017-11-08 DIAGNOSIS — G529 Cranial nerve disorder, unspecified: Secondary | ICD-10-CM | POA: Diagnosis not present

## 2017-11-08 DIAGNOSIS — M961 Postlaminectomy syndrome, not elsewhere classified: Secondary | ICD-10-CM | POA: Diagnosis not present

## 2017-11-08 DIAGNOSIS — M791 Myalgia, unspecified site: Secondary | ICD-10-CM | POA: Diagnosis not present

## 2017-11-08 DIAGNOSIS — G894 Chronic pain syndrome: Secondary | ICD-10-CM | POA: Diagnosis not present

## 2017-11-18 DIAGNOSIS — M94262 Chondromalacia, left knee: Secondary | ICD-10-CM | POA: Diagnosis not present

## 2017-11-18 DIAGNOSIS — M9429 Chondromalacia, multiple sites: Secondary | ICD-10-CM | POA: Diagnosis not present

## 2017-11-18 DIAGNOSIS — M23322 Other meniscus derangements, posterior horn of medial meniscus, left knee: Secondary | ICD-10-CM | POA: Diagnosis not present

## 2017-11-18 DIAGNOSIS — S83272A Complex tear of lateral meniscus, current injury, left knee, initial encounter: Secondary | ICD-10-CM | POA: Diagnosis not present

## 2017-11-18 DIAGNOSIS — M23362 Other meniscus derangements, other lateral meniscus, left knee: Secondary | ICD-10-CM | POA: Diagnosis not present

## 2017-11-18 DIAGNOSIS — Z87891 Personal history of nicotine dependence: Secondary | ICD-10-CM | POA: Diagnosis not present

## 2017-11-18 DIAGNOSIS — Z79899 Other long term (current) drug therapy: Secondary | ICD-10-CM | POA: Diagnosis not present

## 2017-11-18 DIAGNOSIS — I10 Essential (primary) hypertension: Secondary | ICD-10-CM | POA: Diagnosis not present

## 2017-11-18 DIAGNOSIS — G4730 Sleep apnea, unspecified: Secondary | ICD-10-CM | POA: Diagnosis not present

## 2017-11-18 DIAGNOSIS — M659 Synovitis and tenosynovitis, unspecified: Secondary | ICD-10-CM | POA: Diagnosis not present

## 2017-11-18 DIAGNOSIS — M1712 Unilateral primary osteoarthritis, left knee: Secondary | ICD-10-CM | POA: Diagnosis not present

## 2017-11-18 DIAGNOSIS — M2242 Chondromalacia patellae, left knee: Secondary | ICD-10-CM | POA: Diagnosis not present

## 2017-11-18 DIAGNOSIS — E7800 Pure hypercholesterolemia, unspecified: Secondary | ICD-10-CM | POA: Diagnosis not present

## 2017-11-18 DIAGNOSIS — M6588 Other synovitis and tenosynovitis, other site: Secondary | ICD-10-CM | POA: Diagnosis not present

## 2017-11-18 DIAGNOSIS — S83242A Other tear of medial meniscus, current injury, left knee, initial encounter: Secondary | ICD-10-CM | POA: Diagnosis not present

## 2017-11-23 DIAGNOSIS — S83272D Complex tear of lateral meniscus, current injury, left knee, subsequent encounter: Secondary | ICD-10-CM | POA: Diagnosis not present

## 2017-11-23 DIAGNOSIS — R2689 Other abnormalities of gait and mobility: Secondary | ICD-10-CM | POA: Diagnosis not present

## 2017-11-23 DIAGNOSIS — M6281 Muscle weakness (generalized): Secondary | ICD-10-CM | POA: Diagnosis not present

## 2017-11-23 DIAGNOSIS — Z0000 Encounter for general adult medical examination without abnormal findings: Secondary | ICD-10-CM | POA: Diagnosis not present

## 2017-11-23 DIAGNOSIS — S83242D Other tear of medial meniscus, current injury, left knee, subsequent encounter: Secondary | ICD-10-CM | POA: Diagnosis not present

## 2017-11-23 DIAGNOSIS — M25562 Pain in left knee: Secondary | ICD-10-CM | POA: Diagnosis not present

## 2017-11-23 DIAGNOSIS — M1712 Unilateral primary osteoarthritis, left knee: Secondary | ICD-10-CM | POA: Diagnosis not present

## 2017-11-28 DIAGNOSIS — G4733 Obstructive sleep apnea (adult) (pediatric): Secondary | ICD-10-CM | POA: Diagnosis not present

## 2017-11-29 DIAGNOSIS — G4733 Obstructive sleep apnea (adult) (pediatric): Secondary | ICD-10-CM | POA: Diagnosis not present

## 2017-12-14 DIAGNOSIS — G4733 Obstructive sleep apnea (adult) (pediatric): Secondary | ICD-10-CM | POA: Diagnosis not present

## 2017-12-17 DIAGNOSIS — M1712 Unilateral primary osteoarthritis, left knee: Secondary | ICD-10-CM | POA: Diagnosis not present

## 2017-12-28 DIAGNOSIS — G4733 Obstructive sleep apnea (adult) (pediatric): Secondary | ICD-10-CM | POA: Diagnosis not present

## 2018-01-19 DIAGNOSIS — G4739 Other sleep apnea: Secondary | ICD-10-CM | POA: Diagnosis not present

## 2018-01-24 DIAGNOSIS — G894 Chronic pain syndrome: Secondary | ICD-10-CM | POA: Diagnosis not present

## 2018-01-24 DIAGNOSIS — M1990 Unspecified osteoarthritis, unspecified site: Secondary | ICD-10-CM | POA: Diagnosis not present

## 2018-01-24 DIAGNOSIS — G529 Cranial nerve disorder, unspecified: Secondary | ICD-10-CM | POA: Diagnosis not present

## 2018-01-24 DIAGNOSIS — Z79891 Long term (current) use of opiate analgesic: Secondary | ICD-10-CM | POA: Diagnosis not present

## 2018-01-24 DIAGNOSIS — M791 Myalgia, unspecified site: Secondary | ICD-10-CM | POA: Diagnosis not present

## 2018-01-24 DIAGNOSIS — M961 Postlaminectomy syndrome, not elsewhere classified: Secondary | ICD-10-CM | POA: Diagnosis not present

## 2018-01-28 DIAGNOSIS — G4733 Obstructive sleep apnea (adult) (pediatric): Secondary | ICD-10-CM | POA: Diagnosis not present

## 2018-02-28 DIAGNOSIS — G4733 Obstructive sleep apnea (adult) (pediatric): Secondary | ICD-10-CM | POA: Diagnosis not present

## 2018-02-28 DIAGNOSIS — M1711 Unilateral primary osteoarthritis, right knee: Secondary | ICD-10-CM | POA: Diagnosis not present

## 2018-03-29 DIAGNOSIS — G4733 Obstructive sleep apnea (adult) (pediatric): Secondary | ICD-10-CM | POA: Diagnosis not present

## 2018-04-04 DIAGNOSIS — M1990 Unspecified osteoarthritis, unspecified site: Secondary | ICD-10-CM | POA: Diagnosis not present

## 2018-04-04 DIAGNOSIS — M961 Postlaminectomy syndrome, not elsewhere classified: Secondary | ICD-10-CM | POA: Diagnosis not present

## 2018-04-04 DIAGNOSIS — Z79891 Long term (current) use of opiate analgesic: Secondary | ICD-10-CM | POA: Diagnosis not present

## 2018-04-04 DIAGNOSIS — M791 Myalgia, unspecified site: Secondary | ICD-10-CM | POA: Diagnosis not present

## 2018-04-04 DIAGNOSIS — G529 Cranial nerve disorder, unspecified: Secondary | ICD-10-CM | POA: Diagnosis not present

## 2018-04-04 DIAGNOSIS — G894 Chronic pain syndrome: Secondary | ICD-10-CM | POA: Diagnosis not present

## 2018-04-29 DIAGNOSIS — G4733 Obstructive sleep apnea (adult) (pediatric): Secondary | ICD-10-CM | POA: Diagnosis not present

## 2018-05-22 DIAGNOSIS — M961 Postlaminectomy syndrome, not elsewhere classified: Secondary | ICD-10-CM | POA: Diagnosis not present

## 2018-05-22 DIAGNOSIS — G529 Cranial nerve disorder, unspecified: Secondary | ICD-10-CM | POA: Diagnosis not present

## 2018-05-22 DIAGNOSIS — G894 Chronic pain syndrome: Secondary | ICD-10-CM | POA: Diagnosis not present

## 2018-05-22 DIAGNOSIS — M1990 Unspecified osteoarthritis, unspecified site: Secondary | ICD-10-CM | POA: Diagnosis not present

## 2018-05-22 DIAGNOSIS — M791 Myalgia, unspecified site: Secondary | ICD-10-CM | POA: Diagnosis not present

## 2018-05-29 DIAGNOSIS — G4733 Obstructive sleep apnea (adult) (pediatric): Secondary | ICD-10-CM | POA: Diagnosis not present

## 2018-06-01 DIAGNOSIS — G4733 Obstructive sleep apnea (adult) (pediatric): Secondary | ICD-10-CM | POA: Diagnosis not present

## 2018-06-08 DIAGNOSIS — M1711 Unilateral primary osteoarthritis, right knee: Secondary | ICD-10-CM | POA: Diagnosis not present

## 2018-06-10 DIAGNOSIS — M25461 Effusion, right knee: Secondary | ICD-10-CM | POA: Diagnosis not present

## 2018-06-10 DIAGNOSIS — M25561 Pain in right knee: Secondary | ICD-10-CM | POA: Diagnosis not present

## 2018-06-10 DIAGNOSIS — M23321 Other meniscus derangements, posterior horn of medial meniscus, right knee: Secondary | ICD-10-CM | POA: Diagnosis not present

## 2018-06-29 DIAGNOSIS — G4733 Obstructive sleep apnea (adult) (pediatric): Secondary | ICD-10-CM | POA: Diagnosis not present

## 2018-07-06 DIAGNOSIS — M1711 Unilateral primary osteoarthritis, right knee: Secondary | ICD-10-CM | POA: Diagnosis not present

## 2018-07-13 DIAGNOSIS — M1711 Unilateral primary osteoarthritis, right knee: Secondary | ICD-10-CM | POA: Diagnosis not present

## 2018-07-15 DIAGNOSIS — S46212A Strain of muscle, fascia and tendon of other parts of biceps, left arm, initial encounter: Secondary | ICD-10-CM | POA: Diagnosis not present

## 2018-07-16 DIAGNOSIS — M75102 Unspecified rotator cuff tear or rupture of left shoulder, not specified as traumatic: Secondary | ICD-10-CM | POA: Diagnosis not present

## 2018-07-16 DIAGNOSIS — S46212A Strain of muscle, fascia and tendon of other parts of biceps, left arm, initial encounter: Secondary | ICD-10-CM | POA: Diagnosis not present

## 2018-07-16 DIAGNOSIS — X58XXXA Exposure to other specified factors, initial encounter: Secondary | ICD-10-CM | POA: Diagnosis not present

## 2018-07-16 DIAGNOSIS — Z96612 Presence of left artificial shoulder joint: Secondary | ICD-10-CM | POA: Diagnosis not present

## 2018-07-18 DIAGNOSIS — M25512 Pain in left shoulder: Secondary | ICD-10-CM | POA: Diagnosis not present

## 2018-07-29 DIAGNOSIS — G4733 Obstructive sleep apnea (adult) (pediatric): Secondary | ICD-10-CM | POA: Diagnosis not present

## 2018-08-01 DIAGNOSIS — M1990 Unspecified osteoarthritis, unspecified site: Secondary | ICD-10-CM | POA: Diagnosis not present

## 2018-08-01 DIAGNOSIS — G529 Cranial nerve disorder, unspecified: Secondary | ICD-10-CM | POA: Diagnosis not present

## 2018-08-01 DIAGNOSIS — M961 Postlaminectomy syndrome, not elsewhere classified: Secondary | ICD-10-CM | POA: Diagnosis not present

## 2018-08-01 DIAGNOSIS — G894 Chronic pain syndrome: Secondary | ICD-10-CM | POA: Diagnosis not present

## 2018-08-01 DIAGNOSIS — M791 Myalgia, unspecified site: Secondary | ICD-10-CM | POA: Diagnosis not present

## 2018-08-01 DIAGNOSIS — Z79891 Long term (current) use of opiate analgesic: Secondary | ICD-10-CM | POA: Diagnosis not present

## 2018-08-10 DIAGNOSIS — G4739 Other sleep apnea: Secondary | ICD-10-CM | POA: Diagnosis not present

## 2018-08-29 DIAGNOSIS — G4733 Obstructive sleep apnea (adult) (pediatric): Secondary | ICD-10-CM | POA: Diagnosis not present

## 2018-09-19 DIAGNOSIS — M1712 Unilateral primary osteoarthritis, left knee: Secondary | ICD-10-CM | POA: Diagnosis not present

## 2018-09-22 DIAGNOSIS — Z125 Encounter for screening for malignant neoplasm of prostate: Secondary | ICD-10-CM | POA: Diagnosis not present

## 2018-09-22 DIAGNOSIS — G43709 Chronic migraine without aura, not intractable, without status migrainosus: Secondary | ICD-10-CM | POA: Diagnosis not present

## 2018-09-22 DIAGNOSIS — Z0000 Encounter for general adult medical examination without abnormal findings: Secondary | ICD-10-CM | POA: Diagnosis not present

## 2018-09-22 DIAGNOSIS — I10 Essential (primary) hypertension: Secondary | ICD-10-CM | POA: Diagnosis not present

## 2018-09-22 DIAGNOSIS — I2510 Atherosclerotic heart disease of native coronary artery without angina pectoris: Secondary | ICD-10-CM | POA: Diagnosis not present

## 2018-09-22 DIAGNOSIS — Z87891 Personal history of nicotine dependence: Secondary | ICD-10-CM | POA: Diagnosis not present

## 2018-09-22 DIAGNOSIS — Z23 Encounter for immunization: Secondary | ICD-10-CM | POA: Diagnosis not present

## 2018-09-22 DIAGNOSIS — E782 Mixed hyperlipidemia: Secondary | ICD-10-CM | POA: Diagnosis not present

## 2018-09-22 DIAGNOSIS — R7303 Prediabetes: Secondary | ICD-10-CM | POA: Diagnosis not present

## 2018-09-29 DIAGNOSIS — G4733 Obstructive sleep apnea (adult) (pediatric): Secondary | ICD-10-CM | POA: Diagnosis not present

## 2018-10-29 DIAGNOSIS — G4733 Obstructive sleep apnea (adult) (pediatric): Secondary | ICD-10-CM | POA: Diagnosis not present

## 2018-11-01 DIAGNOSIS — R1310 Dysphagia, unspecified: Secondary | ICD-10-CM | POA: Diagnosis not present

## 2018-11-01 DIAGNOSIS — Z1211 Encounter for screening for malignant neoplasm of colon: Secondary | ICD-10-CM | POA: Diagnosis not present

## 2018-11-07 DIAGNOSIS — G894 Chronic pain syndrome: Secondary | ICD-10-CM | POA: Diagnosis not present

## 2018-11-07 DIAGNOSIS — M1990 Unspecified osteoarthritis, unspecified site: Secondary | ICD-10-CM | POA: Diagnosis not present

## 2018-11-07 DIAGNOSIS — Z79891 Long term (current) use of opiate analgesic: Secondary | ICD-10-CM | POA: Diagnosis not present

## 2018-11-07 DIAGNOSIS — M791 Myalgia, unspecified site: Secondary | ICD-10-CM | POA: Diagnosis not present

## 2018-11-07 DIAGNOSIS — M961 Postlaminectomy syndrome, not elsewhere classified: Secondary | ICD-10-CM | POA: Diagnosis not present

## 2018-11-07 DIAGNOSIS — G529 Cranial nerve disorder, unspecified: Secondary | ICD-10-CM | POA: Diagnosis not present

## 2018-11-18 DIAGNOSIS — M1712 Unilateral primary osteoarthritis, left knee: Secondary | ICD-10-CM | POA: Diagnosis not present

## 2018-12-20 DIAGNOSIS — Z20828 Contact with and (suspected) exposure to other viral communicable diseases: Secondary | ICD-10-CM | POA: Diagnosis not present
